# Patient Record
Sex: Female | Born: 1985 | Race: White | Hispanic: No | Marital: Married | Smoking: Former smoker
Health system: Southern US, Community
[De-identification: ages and names within clinical notes are randomized; demographics above are authoritative.]

## PROBLEM LIST (undated history)

## (undated) DIAGNOSIS — K5190 Ulcerative colitis, unspecified, without complications: Secondary | ICD-10-CM

## (undated) DIAGNOSIS — IMO0002 Reserved for concepts with insufficient information to code with codable children: Secondary | ICD-10-CM

## (undated) DIAGNOSIS — E538 Deficiency of other specified B group vitamins: Secondary | ICD-10-CM

## (undated) DIAGNOSIS — D649 Anemia, unspecified: Secondary | ICD-10-CM

## (undated) DIAGNOSIS — F419 Anxiety disorder, unspecified: Secondary | ICD-10-CM

## (undated) DIAGNOSIS — R112 Nausea with vomiting, unspecified: Secondary | ICD-10-CM

## (undated) DIAGNOSIS — R519 Headache, unspecified: Secondary | ICD-10-CM

## (undated) DIAGNOSIS — Z9889 Other specified postprocedural states: Secondary | ICD-10-CM

## (undated) DIAGNOSIS — M329 Systemic lupus erythematosus, unspecified: Secondary | ICD-10-CM

## (undated) DIAGNOSIS — E559 Vitamin D deficiency, unspecified: Secondary | ICD-10-CM

## (undated) DIAGNOSIS — N921 Excessive and frequent menstruation with irregular cycle: Secondary | ICD-10-CM

## (undated) HISTORY — PX: TUBAL LIGATION: SHX77

## (undated) HISTORY — PX: BREAST LUMPECTOMY: SHX2

## (undated) HISTORY — PX: TONSILLECTOMY AND ADENOIDECTOMY: SUR1326

## (undated) HISTORY — PX: TONSILLECTOMY: SUR1361

---

## 2019-01-04 ENCOUNTER — Other Ambulatory Visit: Payer: Self-pay

## 2019-01-04 ENCOUNTER — Emergency Department
Admission: EM | Admit: 2019-01-04 | Discharge: 2019-01-04 | Disposition: A | Discharge status: Home / Self Care | Payer: Medicaid Other | Attending: Emergency Medicine | Admitting: Emergency Medicine

## 2019-01-04 ENCOUNTER — Encounter: Payer: Self-pay | Admitting: *Deleted

## 2019-01-04 ENCOUNTER — Emergency Department: Payer: Medicaid Other

## 2019-01-04 DIAGNOSIS — R197 Diarrhea, unspecified: Secondary | ICD-10-CM | POA: Insufficient documentation

## 2019-01-04 DIAGNOSIS — Z79899 Other long term (current) drug therapy: Secondary | ICD-10-CM | POA: Insufficient documentation

## 2019-01-04 DIAGNOSIS — R1013 Epigastric pain: Secondary | ICD-10-CM | POA: Insufficient documentation

## 2019-01-04 LAB — URINALYSIS, COMPLETE (UACMP) WITH MICROSCOPIC
Bacteria, UA: NONE SEEN
Bilirubin Urine: NEGATIVE
Glucose, UA: NEGATIVE mg/dL
Hgb urine dipstick: NEGATIVE
Ketones, ur: 20 mg/dL — AB
Leukocytes,Ua: NEGATIVE
Nitrite: NEGATIVE
Protein, ur: NEGATIVE mg/dL
Specific Gravity, Urine: 1.019 (ref 1.005–1.030)
pH: 5 (ref 5.0–8.0)

## 2019-01-04 LAB — CBC
HCT: 40.6 % (ref 36.0–46.0)
Hemoglobin: 13.1 g/dL (ref 12.0–15.0)
MCH: 27.7 pg (ref 26.0–34.0)
MCHC: 32.3 g/dL (ref 30.0–36.0)
MCV: 85.8 fL (ref 80.0–100.0)
Platelets: 255 10*3/uL (ref 150–400)
RBC: 4.73 MIL/uL (ref 3.87–5.11)
RDW: 15.7 % — ABNORMAL HIGH (ref 11.5–15.5)
WBC: 5.7 10*3/uL (ref 4.0–10.5)
nRBC: 0 % (ref 0.0–0.2)

## 2019-01-04 LAB — C-REACTIVE PROTEIN: CRP: <0.8 mg/dL (ref <1.0)

## 2019-01-04 LAB — COMPREHENSIVE METABOLIC PANEL
ALT: 12 U/L (ref 0–44)
AST: 21 U/L (ref 15–41)
Albumin: 4 g/dL (ref 3.5–5.0)
Alkaline Phosphatase: 52 U/L (ref 38–126)
Anion gap: 10 (ref 5–15)
BUN: 9 mg/dL (ref 6–20)
CO2: 21 mmol/L — ABNORMAL LOW (ref 22–32)
Calcium: 9.1 mg/dL (ref 8.9–10.3)
Chloride: 107 mmol/L (ref 98–111)
Creatinine, Ser: 0.74 mg/dL (ref 0.44–1.00)
GFR calc Af Amer: >60 mL/min (ref >60)
GFR calc non Af Amer: >60 mL/min (ref >60)
Glucose, Bld: 99 mg/dL (ref 70–99)
Potassium: 3.9 mmol/L (ref 3.5–5.1)
Sodium: 138 mmol/L (ref 135–145)
Total Bilirubin: 0.6 mg/dL (ref 0.3–1.2)
Total Protein: 8.2 g/dL — ABNORMAL HIGH (ref 6.5–8.1)

## 2019-01-04 LAB — LIPASE, BLOOD: Lipase: 26 U/L (ref 11–51)

## 2019-01-04 LAB — SEDIMENTATION RATE: Sed Rate: 35 mm/hr — ABNORMAL HIGH (ref 0–20)

## 2019-01-04 LAB — POCT PREGNANCY, URINE: Preg Test, Ur: NEGATIVE

## 2019-01-04 MED ORDER — MORPHINE SULFATE (PF) 2 MG/ML IV SOLN
2.0000 mg | Freq: Once | INTRAVENOUS | Status: AC
  Administered 2019-01-04: 17:00:00 2 mg via INTRAVENOUS
  Filled 2019-01-04: qty 1

## 2019-01-04 MED ORDER — ALUM & MAG HYDROXIDE-SIMETH 200-200-20 MG/5ML PO SUSP
30.0000 mL | Freq: Once | ORAL | Status: AC
  Administered 2019-01-04: 17:00:00 30 mL via ORAL
  Filled 2019-01-04: qty 30

## 2019-01-04 MED ORDER — ONDANSETRON HCL 4 MG/2ML IJ SOLN
4.0000 mg | Freq: Once | INTRAMUSCULAR | Status: AC
  Administered 2019-01-04: 17:00:00 4 mg via INTRAVENOUS
  Filled 2019-01-04: qty 2

## 2019-01-04 MED ORDER — IOHEXOL 240 MG/ML SOLN
25.0000 mL | INTRAMUSCULAR | Status: AC
  Administered 2019-01-04: 25 mL via ORAL
  Filled 2019-01-04 (×2): qty 25

## 2019-01-04 MED ORDER — TRAMADOL HCL 50 MG PO TABS: 50.0000 mg | ORAL_TABLET | Freq: Four times a day (QID) | ORAL | 0 refills | Status: AC | PRN

## 2019-01-04 MED ORDER — SODIUM CHLORIDE 0.9 % IV BOLUS: 1000.0000 mL | Freq: Once | INTRAVENOUS | Status: DC

## 2019-01-04 MED ORDER — IOHEXOL 300 MG/ML  SOLN
75.0000 mL | Freq: Once | INTRAMUSCULAR | Status: AC | PRN
  Administered 2019-01-04: 18:00:00 75 mL via INTRAVENOUS
  Filled 2019-01-04: qty 75

## 2019-01-04 MED ORDER — PREDNISONE 10 MG (21) PO TBPK: ORAL_TABLET | ORAL | 0 refills | Status: DC

## 2019-01-04 NOTE — ED Triage Notes (Signed)
Pt to triage via wheelchair.  Pt has abd pain.  Pt has diarrhea x 3 today.  No vomiting. Hx colitis.  Pt alert.

## 2019-01-04 NOTE — ED Notes (Signed)
Patient transported to CT 

## 2019-01-04 NOTE — ED Provider Notes (Signed)
[facility] Emergency Department Provider Note  ____________________________________________  Time seen: Approximately 4:42 PM  I have reviewed the triage vital signs and the nursing notes.   HISTORY  Chief Complaint Abdominal Pain    HPI Diana Ramos is a 33 y.o. female with a history of ulcerative colitis, presents to the emergency department with 8 out of 10, episodic epigastric abdominal pain worsened with movement and relieved with rest for the past 24 hours.  Patient reports that her abdominal pain is associated with approximately 3-4 episodes of nonbloody diarrhea over the past 1 day.  She is nauseated but has not experienced emesis.  Patient reports that she was admitted 2 to 3 years ago in [place] for ulcerative colitis.  Patient reports that at the time, she did not have health insurance and was unable to follow-up with GI.  She has not been treated with any medications chronically for ulcerative colitis.  Patient reports that she has had mild flares of ulcerative colitis since admission but reports that symptoms have been manageable at home.  Her abdominal pain has not been this bad since prior admission.  She denies fever and chills at home.  She has not taken any medications to relieve her abdominal pain.    History reviewed. No pertinent past medical history.  There are no active problems to display for this patient.     Prior to Admission medications   Medication Sig Start Date End Date Taking? Authorizing Provider  predniSONE (STERAPRED UNI-PAK 21 TAB) 10 MG (21) TBPK tablet Take 6 tabs the the 1st day. Take 6 tabs the the 2nd day. Take 5 tabs the the 3rd day. Take 5 tabs the 4th day. Take 4 tabs the the 5th day.Take 4 tabs the the 6th day.Take 3 tabs the 7th day.Take 3 tabs the 8th day. Take 2 tabs the 9th day. Take 2 tabs the 10th day. Take 1 tab the 11th day. Take 1 tab the 12th day. 01/04/19   Lannie Fields, PA-C  SPRINTEC 28 0.25-35  MG-MCG tablet Take 1 tablet by mouth daily. 12/04/18   [provider]  traMADol (ULTRAM) 50 MG tablet Take 1 tablet (50 mg total) by mouth every 6 (six) hours as needed for up to 3 days. 01/04/19 01/07/19  Lannie Fields, PA-C    Allergies Ciprofloxacin  No family history on file.  Social History Social History   Tobacco Use  . Smoking status: Never Smoker  . Smokeless tobacco: Never Used  Substance Use Topics  . Alcohol use: Not Currently  . Drug use: Not Currently     Review of Systems  Constitutional: No fever/chills Eyes: No visual changes. No discharge ENT: No upper respiratory complaints. Cardiovascular: no chest pain. Respiratory: no cough. No SOB. Gastrointestinal: Patient has epigastric abdominal pain and nausea. Patient has diarrhea.  No constipation. Genitourinary: Negative for dysuria. No hematuria Musculoskeletal: Negative for musculoskeletal pain. Skin: Negative for rash, abrasions, lacerations, ecchymosis. Neurological: Negative for headaches, focal weakness or numbness.   ____________________________________________   PHYSICAL EXAM:  VITAL SIGNS: ED Triage Vitals  Enc Vitals Group     BP 01/04/19 1537 116/77     Pulse Rate 01/04/19 1537 72     Resp 01/04/19 1537 18     Temp 01/04/19 1537 98.1 F (36.7 C)     Temp Source 01/04/19 1537 Oral     SpO2 01/04/19 1537 98 %     Weight 01/04/19 1538 115 lb (52.2 kg)  Height 01/04/19 1538 5' (1.524 m)     Head Circumference --      Peak Flow --      Pain Score 01/04/19 1538 8     Pain Loc --      Pain Edu? --      Excl. in [place]? --      Constitutional: Alert and oriented. Well appearing and in no acute distress. Eyes: Conjunctivae are normal. PERRL. EOMI. Head: Atraumatic. ENT:      Ears: TMs are pearly.       Nose: No congestion/rhinnorhea.      Mouth/Throat: Mucous membranes are moist.  Neck: No stridor. No cervical spine tenderness to palpation. Cardiovascular: Normal rate, regular  rhythm. Normal S1 and S2.  Good peripheral circulation. Respiratory: Normal respiratory effort without tachypnea or retractions. Lungs CTAB. Good air entry to the bases with no decreased or absent breath sounds. Gastrointestinal: Bowel sounds 4 quadrants.  Patient has significant tenderness to palpation in the epigastric abdomen with associated guarding.  No rigidity.  No palpable masses. No distention. No CVA tenderness. Musculoskeletal: Full range of motion to all extremities. No gross deformities appreciated. Neurologic:  Normal speech and language. No gross focal neurologic deficits are appreciated.  Skin:  Skin is warm, dry and intact. No rash noted. Psychiatric: Mood and affect are normal. Speech and behavior are normal. Patient exhibits appropriate insight and judgement.   ____________________________________________   LABS (all labs ordered are listed, but only abnormal results are displayed)  Labs Reviewed  COMPREHENSIVE METABOLIC PANEL - Abnormal; Notable for the following components:      Result Value   CO2 21 (*)    Total Protein 8.2 (*)    All other components within normal limits  CBC - Abnormal; Notable for the following components:   RDW 15.7 (*)    All other components within normal limits  URINALYSIS, COMPLETE (UACMP) WITH MICROSCOPIC - Abnormal; Notable for the following components:   Color, Urine YELLOW (*)    APPearance HAZY (*)    Ketones, ur 20 (*)    All other components within normal limits  LIPASE, BLOOD  SEDIMENTATION RATE  C-REACTIVE PROTEIN  POC URINE PREG, ED  POCT PREGNANCY, URINE   ____________________________________________  EKG   ____________________________________________  RADIOLOGY I personally viewed and evaluated these images as part of my medical decision making, as well as reviewing the written report by the radiologist    Ct Abdomen Pelvis W Contrast  Result Date: 01/04/2019 CLINICAL DATA:  Abdominal pain, diarrhea EXAM: CT  ABDOMEN AND PELVIS WITH CONTRAST TECHNIQUE: Multidetector CT imaging of the abdomen and pelvis was performed using the standard protocol following bolus administration of intravenous contrast. CONTRAST:  73m OMNIPAQUE IOHEXOL 300 MG/ML  SOLN COMPARISON:  None. FINDINGS: Lower chest: No acute abnormality. Hepatobiliary: No focal liver abnormality is seen. No gallstones, gallbladder wall thickening, or biliary dilatation. Pancreas: Unremarkable. No pancreatic ductal dilatation or surrounding inflammatory changes. Spleen: Normal in size without focal abnormality. Adrenals/Urinary Tract: Adrenal glands are unremarkable. Kidneys are normal, without renal calculi, focal lesion, or hydronephrosis. Bladder is unremarkable. Stomach/Bowel: Stomach is within normal limits. Appendix appears normal. No bowel dilatation. Distal and terminal ileal bowel wall thickening which is fluid-filled most concerning for enteritis secondary to an inflammatory or infectious etiology. Small amount of pelvic free fluid. Vascular/Lymphatic: No significant vascular findings are present. No enlarged abdominal or pelvic lymph nodes. Reproductive: Uterus and bilateral adnexa are unremarkable. Other: No abdominal wall hernia or abnormality. No abdominopelvic  ascites. Musculoskeletal: No acute osseous abnormality. No aggressive osseous lesion. IMPRESSION: 1. Distal and terminal ileal bowel wall thickening which is fluid-filled most concerning for enteritis secondary to an inflammatory or infectious etiology. Small amount of pelvic free fluid. Electronically Signed   By: Kathreen Devoid   On: 01/04/2019 18:29    ____________________________________________    PROCEDURES  Procedure(s) performed:    Procedures    Medications  iohexol (OMNIPAQUE) 240 MG/ML injection 25 mL (25 mLs Oral Contrast Given 01/04/19 1644)  morphine 2 MG/ML injection 2 mg (2 mg Intravenous Given 01/04/19 1647)  sodium chloride 0.9 % bolus 1,000 mL (1,000 mLs  Intravenous Bolus 01/04/19 1652)  ondansetron (ZOFRAN) injection 4 mg (4 mg Intravenous Given 01/04/19 1647)  alum & mag hydroxide-simeth (MAALOX/MYLANTA) 200-200-20 MG/5ML suspension 30 mL (30 mLs Oral Given 01/04/19 1658)  iohexol (OMNIPAQUE) 300 MG/ML solution 75 mL (75 mLs Intravenous Contrast Given 01/04/19 1754)     ____________________________________________   INITIAL IMPRESSION / ASSESSMENT AND PLAN / ED COURSE  Pertinent labs & imaging results that were available during my care of the patient were reviewed by me and considered in my medical decision making (see chart for details).  Review of the [place] CSRS was performed in accordance of the [place] prior to dispensing any controlled drugs.      Assessment and Plan:  Epigastric Abdominal Pain:  33 year old female with a history of ulcerative colitis presents to the emergency department with diarrhea and epigastric abdominal pain for the past 3 days.  Vital signs were reassuring in the emergency department without fever, hypotension or tachycardia.  On physical exam, patient was initially tearful and in a fetal position.  She had guarding with palpation in the epigastric abdomen.  Differential diagnosis included ulcerative colitis, gastritis, pancreatitis, and viral gastroenteritis.  Basic labs obtained in the emergency department were reassuring.  No leukocytosis.  H&H were within reference range.  CT abdomen and pelvis was obtained which revealed ileal wall thickening but no other acute abnormalities.  Patient was given morphine, Zofran and IV fluids in the emergency department.  She was discharged with tramadol and tapered prednisone.  A referral was given to GI.  Vital signs are reassuring prior to discharge.  All patient questions were answered.   ____________________________________________  FINAL CLINICAL IMPRESSION(S) / ED DIAGNOSES  Final diagnoses:  Epigastric pain  Diarrhea, unspecified type      NEW MEDICATIONS  STARTED DURING THIS VISIT:  ED Discharge Orders         Ordered    predniSONE (STERAPRED UNI-PAK 21 TAB) 10 MG (21) TBPK tablet     01/04/19 1851    traMADol (ULTRAM) 50 MG tablet  Every 6 hours PRN     01/04/19 1851              This chart was dictated using voice recognition software/Dragon. Despite best efforts to proofread, errors can occur which can change the meaning. Any change was purely unintentional.    Karren Cobble 01/04/19 1904    Carrie Mew, MD 01/04/19 2312

## 2019-07-04 ENCOUNTER — Other Ambulatory Visit: Payer: Self-pay

## 2019-07-04 DIAGNOSIS — Z20822 Contact with and (suspected) exposure to covid-19: Secondary | ICD-10-CM

## 2019-07-06 LAB — NOVEL CORONAVIRUS, NAA: SARS-CoV-2, NAA: NOT DETECTED

## 2019-09-21 ENCOUNTER — Emergency Department: Payer: Medicaid Other

## 2019-09-21 ENCOUNTER — Other Ambulatory Visit: Payer: Self-pay

## 2019-09-21 ENCOUNTER — Emergency Department
Admission: EM | Admit: 2019-09-21 | Discharge: 2019-09-21 | Disposition: A | Discharge status: Home / Self Care | Payer: Medicaid Other | Attending: Emergency Medicine | Admitting: Emergency Medicine

## 2019-09-21 DIAGNOSIS — Z79899 Other long term (current) drug therapy: Secondary | ICD-10-CM | POA: Insufficient documentation

## 2019-09-21 DIAGNOSIS — K2900 Acute gastritis without bleeding: Secondary | ICD-10-CM

## 2019-09-21 DIAGNOSIS — R1013 Epigastric pain: Secondary | ICD-10-CM | POA: Diagnosis present

## 2019-09-21 HISTORY — DX: Ulcerative colitis, unspecified, without complications: K51.90

## 2019-09-21 LAB — POCT PREGNANCY, URINE: Preg Test, Ur: NEGATIVE

## 2019-09-21 LAB — URINALYSIS, COMPLETE (UACMP) WITH MICROSCOPIC
Bacteria, UA: NONE SEEN
Bilirubin Urine: NEGATIVE
Glucose, UA: NEGATIVE mg/dL
Hgb urine dipstick: NEGATIVE
Ketones, ur: 20 mg/dL — AB
Leukocytes,Ua: NEGATIVE
Nitrite: NEGATIVE
Protein, ur: NEGATIVE mg/dL
Specific Gravity, Urine: 1.02 (ref 1.005–1.030)
pH: 6 (ref 5.0–8.0)

## 2019-09-21 LAB — COMPREHENSIVE METABOLIC PANEL
ALT: 11 U/L (ref 0–44)
AST: 27 U/L (ref 15–41)
Albumin: 4.3 g/dL (ref 3.5–5.0)
Alkaline Phosphatase: 52 U/L (ref 38–126)
Anion gap: 9 (ref 5–15)
BUN: 11 mg/dL (ref 6–20)
CO2: 21 mmol/L — ABNORMAL LOW (ref 22–32)
Calcium: 8.9 mg/dL (ref 8.9–10.3)
Chloride: 108 mmol/L (ref 98–111)
Creatinine, Ser: 0.64 mg/dL (ref 0.44–1.00)
GFR calc Af Amer: >60 mL/min (ref >60)
GFR calc non Af Amer: >60 mL/min (ref >60)
Glucose, Bld: 98 mg/dL (ref 70–99)
Potassium: 4.4 mmol/L (ref 3.5–5.1)
Sodium: 138 mmol/L (ref 135–145)
Total Bilirubin: 1 mg/dL (ref 0.3–1.2)
Total Protein: 8.1 g/dL (ref 6.5–8.1)

## 2019-09-21 LAB — CBC
HCT: 39.1 % (ref 36.0–46.0)
Hemoglobin: 12.7 g/dL (ref 12.0–15.0)
MCH: 27.5 pg (ref 26.0–34.0)
MCHC: 32.5 g/dL (ref 30.0–36.0)
MCV: 84.8 fL (ref 80.0–100.0)
Platelets: 259 10*3/uL (ref 150–400)
RBC: 4.61 MIL/uL (ref 3.87–5.11)
RDW: 13 % (ref 11.5–15.5)
WBC: 7.7 10*3/uL (ref 4.0–10.5)
nRBC: 0 % (ref 0.0–0.2)

## 2019-09-21 LAB — LIPASE, BLOOD: Lipase: 28 U/L (ref 11–51)

## 2019-09-21 MED ORDER — FENTANYL CITRATE (PF) 100 MCG/2ML IJ SOLN
25.0000 ug | Freq: Once | INTRAMUSCULAR | Status: AC
  Administered 2019-09-21: 25 ug via INTRAVENOUS
  Filled 2019-09-21: qty 2

## 2019-09-21 MED ORDER — TRAMADOL HCL 50 MG PO TABS: 50.0000 mg | ORAL_TABLET | Freq: Four times a day (QID) | ORAL | 0 refills | Status: DC | PRN

## 2019-09-21 MED ORDER — ONDANSETRON HCL 4 MG/2ML IJ SOLN
4.0000 mg | Freq: Once | INTRAMUSCULAR | Status: AC
  Administered 2019-09-21: 4 mg via INTRAVENOUS
  Filled 2019-09-21: qty 2

## 2019-09-21 MED ORDER — PANTOPRAZOLE SODIUM 20 MG PO TBEC: 20.0000 mg | DELAYED_RELEASE_TABLET | Freq: Every day | ORAL | 0 refills | Status: DC

## 2019-09-21 MED ORDER — SODIUM CHLORIDE 0.9% FLUSH
3.0000 mL | Freq: Once | INTRAVENOUS | Status: AC
  Administered 2019-09-21: 3 mL via INTRAVENOUS

## 2019-09-21 MED ORDER — FENTANYL CITRATE (PF) 100 MCG/2ML IJ SOLN
50.0000 ug | INTRAMUSCULAR | Status: DC | PRN
  Administered 2019-09-21: 50 ug via INTRAVENOUS
  Filled 2019-09-21: qty 2

## 2019-09-21 MED ORDER — LIDOCAINE VISCOUS HCL 2 % MT SOLN
15.0000 mL | Freq: Once | OROMUCOSAL | Status: AC
  Administered 2019-09-21: 15 mL via ORAL
  Filled 2019-09-21: qty 15

## 2019-09-21 MED ORDER — ALUM & MAG HYDROXIDE-SIMETH 200-200-20 MG/5ML PO SUSP
30.0000 mL | Freq: Once | ORAL | Status: AC
  Administered 2019-09-21: 30 mL via ORAL
  Filled 2019-09-21: qty 30

## 2019-09-21 MED ORDER — ONDANSETRON 4 MG PO TBDP: 4.0000 mg | ORAL_TABLET | Freq: Three times a day (TID) | ORAL | 0 refills | Status: DC | PRN

## 2019-09-21 NOTE — ED Notes (Signed)
See triage note  Presents with abd pain  States pain with n/v started last pm  Hx of colitis   And this pain feels like a flare up  Was given some medication in triage  States eased off pain slightly

## 2019-09-21 NOTE — ED Provider Notes (Signed)
[facility] Emergency Department Provider Note  ____________________________________________   First MD Initiated Contact with Patient 09/21/19 1031     (approximate)  I have reviewed the triage vital signs and the nursing notes.   HISTORY  Chief Complaint Abdominal Pain   HPI Diana Ramos is a 33 y.o. female presents to the ED with complaint of epigastric type pain with history of ulcerative colitis.  Patient states that each time she has had flareups of her ulcerative colitis is always been in the epigastric region.  Patient has currently moved to [place] from [place] and has not seen a doctor in [place].  She states that she does not take any routine medication.  She denies any fever, chills, nausea or vomiting.  There is been no diarrhea.  She rates her pain as 6 out of 10.         Past Medical History:  Diagnosis Date  . Ulcerative colitis ([place])     There are no problems to display for this patient.   Past Surgical History:  Procedure Laterality Date  . CESAREAN SECTION     x3    Prior to Admission medications   Medication Sig Start Date End Date Taking? Authorizing Provider  ondansetron (ZOFRAN ODT) 4 MG disintegrating tablet Take 1 tablet (4 mg total) by mouth every 8 (eight) hours as needed for nausea or vomiting. 09/21/19   Johnn Hai, PA-C  pantoprazole (PROTONIX) 20 MG tablet Take 1 tablet (20 mg total) by mouth daily. 09/21/19   Johnn Hai, PA-C  SPRINTEC 28 0.25-35 MG-MCG tablet Take 1 tablet by mouth daily. 12/04/18   [provider]  traMADol (ULTRAM) 50 MG tablet Take 1 tablet (50 mg total) by mouth every 6 (six) hours as needed. 09/21/19   Johnn Hai, PA-C    Allergies Ciprofloxacin  History reviewed. No pertinent family history.  Social History Social History   Tobacco Use  . Smoking status: Never Smoker  . Smokeless tobacco: Never Used  Substance Use Topics  . Alcohol  use: Not Currently  . Drug use: Yes    Types: Marijuana    Review of Systems Constitutional: No fever/chills Eyes: No visual changes. ENT: No sore throat. Cardiovascular: Denies chest pain. Respiratory: Denies shortness of breath. Gastrointestinal: Positive abdominal pain.  No nausea, no vomiting.  No diarrhea.  No constipation. Genitourinary: Negative for dysuria. Musculoskeletal: Negative for back pain. Skin: Negative for rash. Neurological: Negative for headaches, focal weakness or numbness.  ____________________________________________   PHYSICAL EXAM:  VITAL SIGNS: ED Triage Vitals  Enc Vitals Group     BP 09/21/19 0846 120/73     Pulse Rate 09/21/19 0846 85     Resp 09/21/19 0846 16     Temp 09/21/19 1034 98 F (36.7 C)     Temp Source 09/21/19 0846 Oral     SpO2 09/21/19 0846 98 %     Weight 09/21/19 0847 130 lb (59 kg)     Height 09/21/19 0847 5' (1.524 m)     Head Circumference --      Peak Flow --      Pain Score 09/21/19 0846 6     Pain Loc --      Pain Edu? --      Excl. in [place]? --    Constitutional: Alert and oriented. Well appearing and in no acute distress. Eyes: Conjunctivae are normal.  Head: Atraumatic. Neck: No stridor.   Cardiovascular: Normal rate,  regular rhythm. Grossly normal heart sounds.  Good peripheral circulation. Respiratory: Normal respiratory effort.  No retractions. Lungs CTAB. Gastrointestinal: Soft with epigastric tenderness noted on palpation.  There is no tenderness noted around the periumbilical area or lower quadrants.  Sounds normoactive x4 quadrants.  No rebound or referred pain is noted.  No distention.  Musculoskeletal: Moves upper and lower extremities that any difficulty.  Normal gait was noted. Neurologic:  Normal speech and language. No gross focal neurologic deficits are appreciated. No gait instability. Skin:  Skin is warm, dry and intact. No rash noted. Psychiatric: Mood and affect are normal. Speech and behavior are  normal.  ____________________________________________   LABS (all labs ordered are listed, but only abnormal results are displayed)  Labs Reviewed  COMPREHENSIVE METABOLIC PANEL - Abnormal; Notable for the following components:      Result Value   CO2 21 (*)    All other components within normal limits  URINALYSIS, COMPLETE (UACMP) WITH MICROSCOPIC - Abnormal; Notable for the following components:   Color, Urine YELLOW (*)    APPearance HAZY (*)    Ketones, ur 20 (*)    All other components within normal limits  LIPASE, BLOOD  CBC  POC URINE PREG, ED  POCT PREGNANCY, URINE    RADIOLOGY   Official radiology report(s): DG Abdomen 1 View  Result Date: 09/21/2019 CLINICAL DATA:  Abdominal pain EXAM: ABDOMEN - 1 VIEW COMPARISON:  None. FINDINGS: The bowel gas pattern is normal. No radio-opaque calculi or other significant radiographic abnormality are seen. IMPRESSION: Negative. Electronically Signed   By: Kerby Moors M.D.   On: 09/21/2019 11:44    ____________________________________________   PROCEDURES  Procedure(s) performed (including Critical Care):  Procedures ____________________________________________   INITIAL IMPRESSION / ASSESSMENT AND PLAN / ED COURSE  As part of my medical decision making, I reviewed the following data within the electronic MEDICAL RECORD NUMBER Notes from prior ED visits and [place] Controlled Substance Database  33 year old female presents to the ED complaining of a flareup of her "ulcerative colitis".  Patient states that she recently moved from [place] to [place] and has not had the money to obtain a PCP.  Patient denies any nausea, vomiting, diarrhea.  There is been no fever or chills.  On exam there is only epigastric tenderness.  Labs are reassuring and x-ray did not show any free air.  Patient was given a GI cocktail which she refused to drink.  Patient was given both Zofran and fentanyl prior to being seen.  While waiting for her  x-ray and lab results she was given another nontoxic prior to discharge.  Because her exam shows more epigastric tenderness she was given a prescription for Zofran ODT, Protonix 20 mg, and tramadol if needed for pain.  She is encouraged to watch what she is eating, no alcohol, no smoking.  She is encouraged to follow-up with Dr. Alice Reichert if any continued problems.  ____________________________________________   FINAL CLINICAL IMPRESSION(S) / ED DIAGNOSES  Final diagnoses:  Acute gastritis without hemorrhage, unspecified gastritis type     ED Discharge Orders         Ordered    pantoprazole (PROTONIX) 20 MG tablet  Daily     09/21/19 1241    traMADol (ULTRAM) 50 MG tablet  Every 6 hours PRN     09/21/19 1241    ondansetron (ZOFRAN ODT) 4 MG disintegrating tablet  Every 8 hours PRN     09/21/19 1241  Note:  This document was prepared using Dragon voice recognition software and may include unintentional dictation errors.    Johnn Hai, PA-C 09/21/19 1600    Lavonia Drafts, MD 09/21/19 [phone]

## 2019-09-21 NOTE — ED Triage Notes (Signed)
Pt c/o abd pain with N/V/D states she has a hx of ulcerative colitis and this feels like a flare up.

## 2019-09-21 NOTE — Discharge Instructions (Signed)
Call make an appointment with the gastroenterologist as listed on your discharge papers.  Begin taking medication as directed.  Zofran is for nausea, tramadol for pain and Protonix is to reduce the acid in your stomach which should help with the pain that you are experiencing today.  Do not smoke or drink alcohol as this will increase your pain.  Increase fluids.  Do not drive or operate machinery while taking the tramadol as it could cause drowsiness.

## 2020-01-05 ENCOUNTER — Ambulatory Visit: Payer: Medicaid Other | Attending: Internal Medicine

## 2020-01-05 DIAGNOSIS — Z23 Encounter for immunization: Secondary | ICD-10-CM

## 2020-01-05 NOTE — Progress Notes (Signed)
   Covid-19 Vaccination Clinic  Name:  Maikayla Beggs    MRN: [id number] DOB: Nov 06, 1985  01/05/2020  Ms. Sadiq was observed post Covid-19 immunization for 15 minutes without incident. She was provided with Vaccine Information Sheet and instruction to access the V-Safe system.   Ms. Abadi was instructed to call 911 with any severe reactions post vaccine: Marland Kitchen Difficulty breathing  . Swelling of face and throat  . A fast heartbeat  . A bad rash all over body  . Dizziness and weakness   Immunizations Administered    Name Date Dose VIS Date Route   Pfizer COVID-19 Vaccine 01/05/2020 11:42 AM 0.3 mL 09/01/2019 Intramuscular   Manufacturer: [place]   Lot: [id number]   [place] [employer]: [id number]

## 2020-01-30 ENCOUNTER — Ambulatory Visit: Payer: Medicaid Other | Attending: Internal Medicine

## 2020-01-30 DIAGNOSIS — Z23 Encounter for immunization: Secondary | ICD-10-CM

## 2020-01-30 NOTE — Progress Notes (Signed)
   Covid-19 Vaccination Clinic  Name:  Diana Ramos    MRN: [id number] DOB: Mar 04, 1986  01/30/2020  [name] was observed post Covid-19 immunization for 15 minutes without incident. She was provided with Vaccine Information Sheet and instruction to access the V-Safe system.   [name] was instructed to call 911 with any severe reactions post vaccine: Marland Kitchen Difficulty breathing  . Swelling of face and throat  . A fast heartbeat  . A bad rash all over body  . Dizziness and weakness   Immunizations Administered    Name Date Dose VIS Date Route   Pfizer COVID-19 Vaccine 01/30/2020 11:14 AM 0.3 mL 11/15/2018 Intramuscular   Manufacturer: [place]   Lot: [id number]   [place]: [id number]

## 2020-05-02 ENCOUNTER — Other Ambulatory Visit: Payer: Self-pay

## 2020-05-02 DIAGNOSIS — N6320 Unspecified lump in the left breast, unspecified quadrant: Secondary | ICD-10-CM

## 2020-05-10 ENCOUNTER — Ambulatory Visit
Admission: RE | Admit: 2020-05-10 | Discharge: 2020-05-10 | Disposition: A | Discharge status: Home / Self Care | Payer: Medicaid Other | Source: Ambulatory Visit

## 2020-05-10 ENCOUNTER — Other Ambulatory Visit: Payer: Self-pay

## 2020-05-10 DIAGNOSIS — N6320 Unspecified lump in the left breast, unspecified quadrant: Secondary | ICD-10-CM

## 2020-05-22 ENCOUNTER — Other Ambulatory Visit: Payer: Self-pay

## 2020-05-22 DIAGNOSIS — N6320 Unspecified lump in the left breast, unspecified quadrant: Secondary | ICD-10-CM

## 2020-05-22 DIAGNOSIS — R928 Other abnormal and inconclusive findings on diagnostic imaging of breast: Secondary | ICD-10-CM

## 2020-05-30 ENCOUNTER — Ambulatory Visit
Admission: RE | Admit: 2020-05-30 | Discharge: 2020-05-30 | Disposition: A | Discharge status: Home / Self Care | Payer: Medicaid Other | Source: Ambulatory Visit

## 2020-05-30 DIAGNOSIS — N6320 Unspecified lump in the left breast, unspecified quadrant: Secondary | ICD-10-CM | POA: Diagnosis present

## 2020-05-30 DIAGNOSIS — R928 Other abnormal and inconclusive findings on diagnostic imaging of breast: Secondary | ICD-10-CM | POA: Diagnosis not present

## 2020-05-30 HISTORY — PX: BREAST BIOPSY: SHX20

## 2020-06-03 LAB — SURGICAL PATHOLOGY

## 2020-06-04 ENCOUNTER — Telehealth: Payer: Self-pay

## 2020-06-04 NOTE — Telephone Encounter (Signed)
Diana Ramos has an appt with Dr. Lysle Pearl on 06/11/2020 at 10:15.  Patient is aware of time/date.

## 2020-06-11 ENCOUNTER — Ambulatory Visit: Payer: Self-pay | Admitting: Surgery

## 2020-06-11 NOTE — H&P (Signed)
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 female who was referred by Diana Ramos, C* for evaluation of above. Change was noted on last screening mammogram. Patient does routinely do self breast exams.  Patient has noted a change on breast exam. Age of menarche was 27. Patient denies hormonal therapy. Patient is G6P3. Age of first live birth was 30. Patient did breast feed. Patient denies nipple discharge. Patient denies previous breast biopsy. Patient denies a personal history of breast cancer.   Past Medical History:  has a past medical history of Anemia, Migraine, and Noninfective gastroenteritis and colitis, unspecified.  Past Surgical History:  has a past surgical history that includes Cesarean section; Tonsillectomy; and unlisted procedure dentoalveolar structures.  Family History: family history includes Arthritis in her maternal grandmother, mother, and paternal grandmother; Atrial fibrillation (Abnormal heart rhythm sometimes requiring treatment with blood thinners) in her father; Bladder Cancer in her father; COPD in her mother; Berenice Primas' disease in her mother; Heart disease in her father and mother; Hyperthyroidism in her mother; Lung cancer in her father and mother.  Social History:  reports that she has quit smoking. She has never used smokeless tobacco. She reports previous alcohol use. She reports current drug use. Drug: Marijuana.  Current Medications: has a current medication list which includes the following prescription(s): acetaminophen and hyoscyamine.  Allergies:  Allergies as of 06/11/2020 - Reviewed 05/01/2020  Allergen Reaction Noted  . Ciprofloxacin Rash 01/04/2019    ROS:  A 15 point review of systems was performed and was negative except as noted in HPI   Objective:     BP 112/71   Pulse 78   Ht 152.4 cm (5')   Wt 52.6 kg (116 lb)   BMI 22.65 kg/m   Constitutional :  alert, appears stated age,  cooperative and no distress  Lymphatics/Throat:  no asymmetry, masses, or scars  Respiratory:  clear to auscultation bilaterally  Cardiovascular:  regular rate and rhythm  Gastrointestinal: soft, non-tender; bowel sounds normal; no masses,  no organomegaly.   Musculoskeletal: Steady gait and movement  Skin: Cool and moist  Psychiatric: Normal affect, non-agitated, not confused  Breast:  Chaperone present for exam.  Right breast exam with palpable fibroadenoma at 9 oclock position as noted in imaging. No other palpable abnormalities in right breast or axilla.  Left breast with palpable area of concern as noted on [place].  Few other areas consistent with fibrous breast in upper quadrants.  No axillary abnormalities    LABS:  SURGICAL PATHOLOGY  CASE: [id number]  PATIENT: Diana Ramos  Surgical Pathology Report      Specimen Submitted:  A. Breast, left 5:00 2cmfn   Clinical History: Palpable mass with angular margins. Ddx: Fibroadenoma,  papilloma, malignancy. Vision tissue marker clip placed following  ultrasound guided biopsy of LEFT breast at 5 o'clock.     DIAGNOSIS:  A. BREAST, LEFT AT 5:00, 2 CM FROM THE NIPPLE; ULTRASOUND-GUIDED CORE  NEEDLE BIOPSY:  - BENIGN INTRADUCTAL PAPILLOMA.  - NEGATIVE FOR ATYPICAL PROLIFERATIVE BREAST DISEASE.   Comment:  Due to the presence of focal areas of solid growth, immunohistochemical  studies were performed. The studies highlight an intact myoepithelial  layer. There is no evidence of invasive carcinoma or encapsulated  papillary carcinoma. However, complete excision is recommended to fully  exclude an encapsulated papillary carcinoma, or other atypical process.   IHC slides were prepared by Launa Grill, [place]. All controls stained  appropriately.  This test was developed and its performance characteristics determined  by [facility]. It has not been cleared or approved by the [place] Food and Drug  Administration. The FDA does  not require this test to go through  premarket FDA review. This test is used for clinical purposes. It should  not be regarded as investigational or for research. This laboratory is  certified under the Clinical Laboratory Improvement Amendments (CLIA) as  qualified to perform high complexity clinical laboratory testing.   GROSS DESCRIPTION:  A. Labeled: Left breast 5:00 2 cm from nipple lower outer quadrant  Received: Formalin  Time/date in fixative: Collected and placed in formalin at 1:34 PM on  05/30/2020  Cold ischemic time: Less than 1 minute  Total fixation time: 8 hours  Core pieces: 2 cores and multiple additional fragments  Size: Range from 1.6-1.9 cm in length and 0.2 cm in diameter  Description: Received are cores and fragments of yellow fibrofatty  tissue. The fragments are 2 x 0.4 x 0.2 cm in aggregate.  Ink color: Black  Entirely submitted in cassettes 1-3 as follows:  1 - 1 core  2 - 1 core  3 - remaining fragments     Final Diagnosis performed by Allena Napoleon, MD.  Electronically signed  06/03/2020 9:35:54AM  The electronic signature indicates that the named AttendingPathologist  has evaluated the specimen  Technical component performed at [facility], [address], [place],  [place] [address] Lab: [phone] Dir: Rush Farmer, MD, MMM  Professional component performed at [facility], [facility], [place], [place], [place] [address] Lab: [phone]  Dir: Dellia Nims. Reuel Derby, MD   RADS: Addendum by Verdell Face, MD on 06/03/2020 4:58 PM EDT  ADDENDUM REPORT: 06/03/2020 14:46   ADDENDUM:  PATHOLOGY revealed: A. BREAST, LEFT AT 5:00, 2 CM FROM THE NIPPLE;  ULTRASOUND-GUIDED CORE NEEDLE BIOPSY: - BENIGN INTRADUCTAL  PAPILLOMA. - NEGATIVE FOR ATYPICAL PROLIFERATIVE BREAST DISEASE.  Comment: Due to the presence of focal areas of solid growth,  immunohistochemical studies were performed. The studies highlight an  intact  myoepithelial layer. There is no evidence of invasive  carcinoma or encapsulated papillary carcinoma. However, complete  excision is recommended to fully exclude an encapsulated papillary  carcinoma, or other atypical process.   Pathology results are CONCORDANT with imaging findings, per Dr.  Valentino Saxon with excision recommended.   Pathology results and recommendations below were discussed with  patient by telephone on 06/03/2020. Patient reported biopsy site  doing much better with only slight tenderness at the site. Post  biopsy care instructions were reviewed, questions were answered and  my direct phone number was provided to patient. Patient was  instructed to call [facility] if any concerns or  questions arise related to the biopsy. Patient verbalized  understanding of recommendation #2 below.   Recommendation:   1. Request for surgical consultation was relayed to [place] RT at [facility] by Electa Sniff RN on  06/03/2020.   2. I explained to patient there will be a six month follow-up RIGHT  breast ultrasound unless there is an upgrade of pathology on the  LEFT breast surgical excision. If there is an upgrade of pathology  on surgically excised specimen, a RIGHT breast ultrasound biopsy  will be scheduled for a RIGHT breast hypoechoic circumscribed  benign-appearing mass measuring 1.5 x 1.8 x 1.0 cm with interspersed  calcifications, at 9 o'clock 5 cm from the nipple. The RIGHT breast  biopsy will be  determined at the time of surgical excision  pathology.   Pathology results reported by Electa Sniff RN on 06/03/2020.     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  Will proceed with full excisional biopsy due to increased risk of upgrade with papilloma diagnosis.  Plan:     1. 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 The risks of general anesthetic, if used, includes MI, CVA, sudden death or even reaction to anesthetic medications also discussed.  Typical post-op recovery time and possbility of activity restrictions were also discussed.  Alternatives include continued observation.  Benefits include possible symptom relief, pathologic evaluation, and/or curative excision.   The patient verbalized understanding and all questions were answered to the patient's satisfaction.  2. Patient has elected to proceed with surgical treatment. Procedure will be scheduled.

## 2020-06-11 NOTE — H&P (View-Only) (Signed)
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 female who was referred by Diana Ramos, C* for evaluation of above. Change was noted on last screening mammogram. Patient does routinely do self breast exams.  Patient has noted a change on breast exam. Age of menarche was 49. Patient denies hormonal therapy. Patient is G6P3. Age of first live birth was 33. Patient did breast feed. Patient denies nipple discharge. Patient denies previous breast biopsy. Patient denies a personal history of breast cancer.   Past Medical History:  has a past medical history of Anemia, Migraine, and Noninfective gastroenteritis and colitis, unspecified.  Past Surgical History:  has a past surgical history that includes Cesarean section; Tonsillectomy; and unlisted procedure dentoalveolar structures.  Family History: family history includes Arthritis in her maternal grandmother, mother, and paternal grandmother; Atrial fibrillation (Abnormal heart rhythm sometimes requiring treatment with blood thinners) in her father; Bladder Cancer in her father; COPD in her mother; Berenice Primas' disease in her mother; Heart disease in her father and mother; Hyperthyroidism in her mother; Lung cancer in her father and mother.  Social History:  reports that she has quit smoking. She has never used smokeless tobacco. She reports previous alcohol use. She reports current drug use. Drug: Marijuana.  Current Medications: has a current medication list which includes the following prescription(s): acetaminophen and hyoscyamine.  Allergies:  Allergies as of 06/11/2020 - Reviewed 05/01/2020  Allergen Reaction Noted  . Ciprofloxacin Rash 01/04/2019    ROS:  A 15 point review of systems was performed and was negative except as noted in HPI   Objective:     BP 112/71   Pulse 78   Ht 152.4 cm (5')   Wt 52.6 kg (116 lb)   BMI 22.65 kg/m   Constitutional :  alert, appears stated age,  cooperative and no distress  Lymphatics/Throat:  no asymmetry, masses, or scars  Respiratory:  clear to auscultation bilaterally  Cardiovascular:  regular rate and rhythm  Gastrointestinal: soft, non-tender; bowel sounds normal; no masses,  no organomegaly.   Musculoskeletal: Steady gait and movement  Skin: Cool and moist  Psychiatric: Normal affect, non-agitated, not confused  Breast:  Chaperone present for exam.  Right breast exam with palpable fibroadenoma at 9 oclock position as noted in imaging. No other palpable abnormalities in right breast or axilla.  Left breast with palpable area of concern as noted on [place].  Few other areas consistent with fibrous breast in upper quadrants.  No axillary abnormalities    LABS:  SURGICAL PATHOLOGY  CASE: [id number]  PATIENT: Diana Ramos  Surgical Pathology Report      Specimen Submitted:  A. Breast, left 5:00 2cmfn   Clinical History: Palpable mass with angular margins. Ddx: Fibroadenoma,  papilloma, malignancy. Vision tissue marker clip placed following  ultrasound guided biopsy of LEFT breast at 5 o'clock.     DIAGNOSIS:  A. BREAST, LEFT AT 5:00, 2 CM FROM THE NIPPLE; ULTRASOUND-GUIDED CORE  NEEDLE BIOPSY:  - BENIGN INTRADUCTAL PAPILLOMA.  - NEGATIVE FOR ATYPICAL PROLIFERATIVE BREAST DISEASE.   Comment:  Due to the presence of focal areas of solid growth, immunohistochemical  studies were performed. The studies highlight an intact myoepithelial  layer. There is no evidence of invasive carcinoma or encapsulated  papillary carcinoma. However, complete excision is recommended to fully  exclude an encapsulated papillary carcinoma, or other atypical process.   IHC slides were prepared by Launa Grill, [place]. All controls stained  appropriately.  This test was developed and its performance characteristics determined  by [facility]. It has not been cleared or approved by the [place] Food and Drug  Administration. The FDA does  not require this test to go through  premarket FDA review. This test is used for clinical purposes. It should  not be regarded as investigational or for research. This laboratory is  certified under the Clinical Laboratory Improvement Amendments (CLIA) as  qualified to perform high complexity clinical laboratory testing.   GROSS DESCRIPTION:  A. Labeled: Left breast 5:00 2 cm from nipple lower outer quadrant  Received: Formalin  Time/date in fixative: Collected and placed in formalin at 1:34 PM on  05/30/2020  Cold ischemic time: Less than 1 minute  Total fixation time: 8 hours  Core pieces: 2 cores and multiple additional fragments  Size: Range from 1.6-1.9 cm in length and 0.2 cm in diameter  Description: Received are cores and fragments of yellow fibrofatty  tissue. The fragments are 2 x 0.4 x 0.2 cm in aggregate.  Ink color: Black  Entirely submitted in cassettes 1-3 as follows:  1 - 1 core  2 - 1 core  3 - remaining fragments     Final Diagnosis performed by Allena Napoleon, MD.  Electronically signed  06/03/2020 9:35:54AM  The electronic signature indicates that the named AttendingPathologist  has evaluated the specimen  Technical component performed at [facility], [address] [place],  [place] [address] Lab: [phone] Dir: Rush Farmer, MD, MMM  Professional component performed at [facility], [facility], [place], [place], [place] [address] Lab: [phone]  Dir: Dellia Nims. Reuel Derby, MD   RADS: Addendum by Verdell Face, MD on 06/03/2020 4:58 PM EDT  ADDENDUM REPORT: 06/03/2020 14:46   ADDENDUM:  PATHOLOGY revealed: A. BREAST, LEFT AT 5:00, 2 CM FROM THE NIPPLE;  ULTRASOUND-GUIDED CORE NEEDLE BIOPSY: - BENIGN INTRADUCTAL  PAPILLOMA. - NEGATIVE FOR ATYPICAL PROLIFERATIVE BREAST DISEASE.  Comment: Due to the presence of focal areas of solid growth,  immunohistochemical studies were performed. The studies highlight an  intact  myoepithelial layer. There is no evidence of invasive  carcinoma or encapsulated papillary carcinoma. However, complete  excision is recommended to fully exclude an encapsulated papillary  carcinoma, or other atypical process.   Pathology results are CONCORDANT with imaging findings, per Dr.  Valentino Saxon with excision recommended.   Pathology results and recommendations below were discussed with  patient by telephone on 06/03/2020. Patient reported biopsy site  doing much better with only slight tenderness at the site. Post  biopsy care instructions were reviewed, questions were answered and  my direct phone number was provided to patient. Patient was  instructed to call [facility] if any concerns or  questions arise related to the biopsy. Patient verbalized  understanding of recommendation #2 below.   Recommendation:   1. Request for surgical consultation was relayed to [place] RT at [facility] by Electa Sniff RN on  06/03/2020.   2. I explained to patient there will be a six month follow-up RIGHT  breast ultrasound unless there is an upgrade of pathology on the  LEFT breast surgical excision. If there is an upgrade of pathology  on surgically excised specimen, a RIGHT breast ultrasound biopsy  will be scheduled for a RIGHT breast hypoechoic circumscribed  benign-appearing mass measuring 1.5 x 1.8 x 1.0 cm with interspersed  calcifications, at 9 o'clock 5 cm from the nipple. The RIGHT breast  biopsy will be  determined at the time of surgical excision  pathology.   Pathology results reported by Electa Sniff RN on 06/03/2020.     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  Will proceed with full excisional biopsy due to increased risk of upgrade with papilloma diagnosis.  Plan:     1. 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 The risks of general anesthetic, if used, includes MI, CVA, sudden death or even reaction to anesthetic medications also discussed.  Typical post-op recovery time and possbility of activity restrictions were also discussed.  Alternatives include continued observation.  Benefits include possible symptom relief, pathologic evaluation, and/or curative excision.   The patient verbalized understanding and all questions were answered to the patient's satisfaction.  2. Patient has elected to proceed with surgical treatment. Procedure will be scheduled.

## 2020-06-12 ENCOUNTER — Other Ambulatory Visit: Payer: Self-pay | Admitting: Surgery

## 2020-06-12 DIAGNOSIS — D242 Benign neoplasm of left breast: Secondary | ICD-10-CM

## 2020-06-18 ENCOUNTER — Encounter
Admission: RE | Admit: 2020-06-18 | Discharge: 2020-06-18 | Disposition: A | Discharge status: Home / Self Care | Payer: Medicaid Other | Source: Ambulatory Visit | Attending: Surgery | Admitting: Surgery

## 2020-06-18 ENCOUNTER — Other Ambulatory Visit: Payer: Self-pay

## 2020-06-18 HISTORY — DX: Systemic lupus erythematosus, unspecified: M32.9

## 2020-06-18 HISTORY — DX: Reserved for concepts with insufficient information to code with codable children: IMO0002

## 2020-06-18 HISTORY — DX: Anemia, unspecified: D64.9

## 2020-06-18 NOTE — Patient Instructions (Addendum)
Your procedure is scheduled on: Friday, Oct. 1 Report to Day Surgery on the 2nd floor of the [facility]. To find out your arrival time, please call [phone] between 1PM - 3PM on: Thursday, Sept. 30  REMEMBER: Instructions that are not followed completely may result in serious medical risk, up to and including death; or upon the discretion of your surgeon and anesthesiologist your surgery may need to be rescheduled.  Do not eat food after midnight the night before surgery.  No gum chewing, lozengers or hard candies.  You may however, drink CLEAR liquids up to 2 hours before you are scheduled to arrive for your surgery. Do not drink anything within 2 hours of your scheduled arrival time.  Clear liquids include: - water  - apple juice without pulp - gatorade (not RED) - black coffee or tea (Do NOT add milk or creamers to the coffee or tea) Do NOT drink anything that is not on this list.  TAKE THESE MEDICATIONS THE MORNING OF SURGERY WITH A SIP OF WATER:  1.  Tylenol if needed for pain  One week prior to surgery: Stop Anti-inflammatories (NSAIDS) such as Advil, Aleve, Ibuprofen, Motrin, Naproxen, Naprosyn and Aspirin based products such as Excedrin, Goodys Powder, BC Powder. Stop ANY OVER THE COUNTER supplements until after surgery.  No Alcohol for 24 hours before or after surgery.  No Smoking including e-cigarettes for 24 hours prior to surgery.   Do not use any "recreational" drugs for at least a week prior to your surgery.  Please be advised that the combination of cocaine and anesthesia may have negative outcomes, up to and including death. If you test positive for cocaine, your surgery will be cancelled.  On the morning of surgery brush your teeth with toothpaste and water, you may rinse your mouth with mouthwash if you wish. Do not swallow any toothpaste or mouthwash.  Do not wear jewelry, make-up, hairpins, clips or nail polish.  Do not wear lotions, powders, or  perfumes.   Do not shave 48 hours prior to surgery.   Do not bring valuables to the hospital. [facility] is not responsible for any missing/lost belongings or valuables.   Use CHG Soap as directed on instruction sheet.  Notify your doctor if there is any change in your medical condition (cold, fever, infection).  Wear comfortable clothing (specific to your surgery type) to the hospital.  Plan for stool softeners for home use; pain medications have a tendency to cause constipation. You can also help prevent constipation by eating foods high in fiber such as fruits and vegetables and drinking plenty of fluids as your diet allows.  After surgery, you can help prevent lung complications by doing breathing exercises.  Take deep breaths and cough every 1-2 hours. Your doctor may order a device called an Incentive Spirometer to help you take deep breaths.  If you are being discharged the day of surgery, you will not be allowed to drive home. You will need a responsible adult (18 years or older) to drive you home and stay with you that night.   If you are taking public transportation, you will need to have a responsible adult (18 years or older) with you. Please confirm with your physician that it is acceptable to use public transportation.   Please call the [facility] Dept. at [phone] if you have any questions about these instructions.  Visitation Policy:  Patients undergoing a surgery or procedure may have one family member or support  person with them as long as that person is not COVID-19 positive or experiencing its symptoms.  That person may remain in the waiting area during the procedure.  Masking is required regardless of vaccination status.

## 2020-06-19 ENCOUNTER — Other Ambulatory Visit: Payer: Self-pay | Admitting: Surgery

## 2020-06-19 ENCOUNTER — Ambulatory Visit
Admission: RE | Admit: 2020-06-19 | Discharge: 2020-06-19 | Disposition: A | Discharge status: Home / Self Care | Payer: Medicaid Other | Source: Ambulatory Visit | Attending: Surgery | Admitting: Surgery

## 2020-06-19 ENCOUNTER — Other Ambulatory Visit
Admission: RE | Admit: 2020-06-19 | Discharge: 2020-06-19 | Disposition: A | Discharge status: Home / Self Care | Payer: Medicaid Other | Source: Ambulatory Visit | Attending: Surgery | Admitting: Surgery

## 2020-06-19 DIAGNOSIS — Z20822 Contact with and (suspected) exposure to covid-19: Secondary | ICD-10-CM | POA: Insufficient documentation

## 2020-06-19 DIAGNOSIS — Z1231 Encounter for screening mammogram for malignant neoplasm of breast: Secondary | ICD-10-CM | POA: Diagnosis not present

## 2020-06-19 DIAGNOSIS — D242 Benign neoplasm of left breast: Secondary | ICD-10-CM

## 2020-06-19 DIAGNOSIS — Z01812 Encounter for preprocedural laboratory examination: Secondary | ICD-10-CM | POA: Diagnosis not present

## 2020-06-19 LAB — SARS CORONAVIRUS 2 (TAT 6-24 HRS): SARS Coronavirus 2: NEGATIVE

## 2020-06-21 ENCOUNTER — Other Ambulatory Visit: Payer: Self-pay

## 2020-06-21 ENCOUNTER — Ambulatory Visit
Admission: RE | Admit: 2020-06-21 | Discharge: 2020-06-21 | Disposition: A | Discharge status: Home / Self Care | Payer: Medicaid Other | Attending: Surgery | Admitting: Surgery

## 2020-06-21 ENCOUNTER — Encounter
Admission: RE | Disposition: A | Discharge status: Home / Self Care | Payer: Self-pay | Source: Home / Self Care | Attending: Surgery

## 2020-06-21 ENCOUNTER — Ambulatory Visit
Admission: RE | Admit: 2020-06-21 | Discharge: 2020-06-21 | Disposition: A | Discharge status: Home / Self Care | Payer: Medicaid Other | Source: Ambulatory Visit | Attending: Surgery | Admitting: Surgery

## 2020-06-21 ENCOUNTER — Encounter: Payer: Self-pay | Admitting: Surgery

## 2020-06-21 ENCOUNTER — Ambulatory Visit: Payer: Medicaid Other | Admitting: Urgent Care

## 2020-06-21 DIAGNOSIS — Z8249 Family history of ischemic heart disease and other diseases of the circulatory system: Secondary | ICD-10-CM | POA: Insufficient documentation

## 2020-06-21 DIAGNOSIS — Z881 Allergy status to other antibiotic agents status: Secondary | ICD-10-CM | POA: Insufficient documentation

## 2020-06-21 DIAGNOSIS — D649 Anemia, unspecified: Secondary | ICD-10-CM | POA: Insufficient documentation

## 2020-06-21 DIAGNOSIS — N62 Hypertrophy of breast: Secondary | ICD-10-CM | POA: Diagnosis not present

## 2020-06-21 DIAGNOSIS — D242 Benign neoplasm of left breast: Secondary | ICD-10-CM

## 2020-06-21 DIAGNOSIS — Z8261 Family history of arthritis: Secondary | ICD-10-CM | POA: Diagnosis not present

## 2020-06-21 DIAGNOSIS — Z8052 Family history of malignant neoplasm of bladder: Secondary | ICD-10-CM | POA: Diagnosis not present

## 2020-06-21 DIAGNOSIS — Z419 Encounter for procedure for purposes other than remedying health state, unspecified: Secondary | ICD-10-CM

## 2020-06-21 DIAGNOSIS — N6022 Fibroadenosis of left breast: Secondary | ICD-10-CM | POA: Diagnosis not present

## 2020-06-21 DIAGNOSIS — Z801 Family history of malignant neoplasm of trachea, bronchus and lung: Secondary | ICD-10-CM | POA: Diagnosis not present

## 2020-06-21 DIAGNOSIS — K279 Peptic ulcer, site unspecified, unspecified as acute or chronic, without hemorrhage or perforation: Secondary | ICD-10-CM | POA: Insufficient documentation

## 2020-06-21 DIAGNOSIS — Z87891 Personal history of nicotine dependence: Secondary | ICD-10-CM | POA: Insufficient documentation

## 2020-06-21 DIAGNOSIS — G43909 Migraine, unspecified, not intractable, without status migrainosus: Secondary | ICD-10-CM | POA: Diagnosis not present

## 2020-06-21 DIAGNOSIS — Z8349 Family history of other endocrine, nutritional and metabolic diseases: Secondary | ICD-10-CM | POA: Insufficient documentation

## 2020-06-21 HISTORY — PX: BREAST EXCISIONAL BIOPSY: SUR124

## 2020-06-21 HISTORY — PX: BREAST BIOPSY WITH RADIO FREQUENCY LOCALIZER: SHX6895

## 2020-06-21 LAB — URINE DRUG SCREEN, QUALITATIVE (ARMC ONLY)
Amphetamines, Ur Screen: NOT DETECTED
Barbiturates, Ur Screen: NOT DETECTED
Benzodiazepine, Ur Scrn: NOT DETECTED
Cannabinoid 50 Ng, Ur ~~LOC~~: POSITIVE — AB
Cocaine Metabolite,Ur ~~LOC~~: NOT DETECTED
MDMA (Ecstasy)Ur Screen: NOT DETECTED
Methadone Scn, Ur: NOT DETECTED
Opiate, Ur Screen: NOT DETECTED
Phencyclidine (PCP) Ur S: NOT DETECTED
Tricyclic, Ur Screen: NOT DETECTED

## 2020-06-21 LAB — POCT PREGNANCY, URINE: Preg Test, Ur: NEGATIVE

## 2020-06-21 SURGERY — BREAST BIOPSY WITH RADIO FREQUENCY LOCALIZER
Anesthesia: General | Site: Breast | Laterality: Left

## 2020-06-21 MED ORDER — CHLORHEXIDINE GLUCONATE 0.12 % MT SOLN: 15.0000 mL | Freq: Once | OROMUCOSAL | Status: AC

## 2020-06-21 MED ORDER — LIDOCAINE HCL (PF) 1 % IJ SOLN
INTRAMUSCULAR | Status: AC
  Filled 2020-06-21: qty 30

## 2020-06-21 MED ORDER — MIDAZOLAM HCL 2 MG/2ML IJ SOLN
INTRAMUSCULAR | Status: AC
  Filled 2020-06-21: qty 2

## 2020-06-21 MED ORDER — BUPIVACAINE-EPINEPHRINE (PF) 0.5% -1:200000 IJ SOLN
INTRAMUSCULAR | Status: AC
  Filled 2020-06-21: qty 30

## 2020-06-21 MED ORDER — ONDANSETRON HCL 4 MG/2ML IJ SOLN
4.0000 mg | Freq: Once | INTRAMUSCULAR | Status: AC
  Administered 2020-06-21: 4 mg via INTRAVENOUS

## 2020-06-21 MED ORDER — ONDANSETRON HCL 4 MG/2ML IJ SOLN
INTRAMUSCULAR | Status: DC | PRN
  Administered 2020-06-21: 4 mg via INTRAVENOUS

## 2020-06-21 MED ORDER — FENTANYL CITRATE (PF) 100 MCG/2ML IJ SOLN
INTRAMUSCULAR | Status: DC | PRN
  Administered 2020-06-21 (×3): 25 ug via INTRAVENOUS

## 2020-06-21 MED ORDER — PROPOFOL 10 MG/ML IV BOLUS
INTRAVENOUS | Status: AC
  Filled 2020-06-21: qty 20

## 2020-06-21 MED ORDER — ORAL CARE MOUTH RINSE: 15.0000 mL | Freq: Once | OROMUCOSAL | Status: AC

## 2020-06-21 MED ORDER — CELECOXIB 200 MG PO CAPS
ORAL_CAPSULE | ORAL | Status: AC
  Administered 2020-06-21: 200 mg via ORAL
  Filled 2020-06-21: qty 1

## 2020-06-21 MED ORDER — DOCUSATE SODIUM 100 MG PO CAPS: 100.0000 mg | ORAL_CAPSULE | Freq: Two times a day (BID) | ORAL | 0 refills | Status: AC | PRN

## 2020-06-21 MED ORDER — DEXAMETHASONE SODIUM PHOSPHATE 10 MG/ML IJ SOLN
INTRAMUSCULAR | Status: DC | PRN
  Administered 2020-06-21: 10 mg via INTRAVENOUS

## 2020-06-21 MED ORDER — LIDOCAINE HCL (CARDIAC) PF 100 MG/5ML IV SOSY
PREFILLED_SYRINGE | INTRAVENOUS | Status: DC | PRN
  Administered 2020-06-21: 40 mg via INTRAVENOUS

## 2020-06-21 MED ORDER — ONDANSETRON HCL 4 MG/2ML IJ SOLN
4.0000 mg | Freq: Once | INTRAMUSCULAR | Status: AC | PRN
  Administered 2020-06-21: 4 mg via INTRAVENOUS

## 2020-06-21 MED ORDER — ONDANSETRON HCL 4 MG/2ML IJ SOLN
INTRAMUSCULAR | Status: AC
  Filled 2020-06-21: qty 2

## 2020-06-21 MED ORDER — FAMOTIDINE 20 MG PO TABS: 20.0000 mg | ORAL_TABLET | Freq: Once | ORAL | Status: AC

## 2020-06-21 MED ORDER — CHLORHEXIDINE GLUCONATE 0.12 % MT SOLN
OROMUCOSAL | Status: AC
  Administered 2020-06-21: 15 mL via OROMUCOSAL
  Filled 2020-06-21: qty 15

## 2020-06-21 MED ORDER — LACTATED RINGERS IV SOLN: INTRAVENOUS | Status: DC

## 2020-06-21 MED ORDER — FAMOTIDINE 20 MG PO TABS
ORAL_TABLET | ORAL | Status: AC
  Administered 2020-06-21: 20 mg via ORAL
  Filled 2020-06-21: qty 1

## 2020-06-21 MED ORDER — METOCLOPRAMIDE HCL 5 MG/ML IJ SOLN: 10.0000 mg | Freq: Once | INTRAMUSCULAR | Status: DC

## 2020-06-21 MED ORDER — METOCLOPRAMIDE HCL 5 MG/ML IJ SOLN
INTRAMUSCULAR | Status: AC
  Administered 2020-06-21: 10 mg
  Filled 2020-06-21: qty 2

## 2020-06-21 MED ORDER — GABAPENTIN 300 MG PO CAPS: 300.0000 mg | ORAL_CAPSULE | ORAL | Status: AC

## 2020-06-21 MED ORDER — FENTANYL CITRATE (PF) 100 MCG/2ML IJ SOLN
INTRAMUSCULAR | Status: AC
  Filled 2020-06-21: qty 2

## 2020-06-21 MED ORDER — CELECOXIB 200 MG PO CAPS: 200.0000 mg | ORAL_CAPSULE | ORAL | Status: AC

## 2020-06-21 MED ORDER — CEFAZOLIN SODIUM-DEXTROSE 2-4 GM/100ML-% IV SOLN
2.0000 g | INTRAVENOUS | Status: AC
  Administered 2020-06-21: 2 g via INTRAVENOUS

## 2020-06-21 MED ORDER — FENTANYL CITRATE (PF) 100 MCG/2ML IJ SOLN: 25.0000 ug | INTRAMUSCULAR | Status: DC | PRN

## 2020-06-21 MED ORDER — LIDOCAINE HCL 1 % IJ SOLN
INTRAMUSCULAR | Status: DC | PRN
  Administered 2020-06-21: 5 mL

## 2020-06-21 MED ORDER — ACETAMINOPHEN 325 MG PO TABS: 650.0000 mg | ORAL_TABLET | Freq: Three times a day (TID) | ORAL | 0 refills | Status: AC | PRN

## 2020-06-21 MED ORDER — GABAPENTIN 300 MG PO CAPS
ORAL_CAPSULE | ORAL | Status: AC
  Administered 2020-06-21: 300 mg via ORAL
  Filled 2020-06-21: qty 1

## 2020-06-21 MED ORDER — LIDOCAINE HCL (PF) 2 % IJ SOLN
INTRAMUSCULAR | Status: AC
  Filled 2020-06-21: qty 5

## 2020-06-21 MED ORDER — MIDAZOLAM HCL 2 MG/2ML IJ SOLN
INTRAMUSCULAR | Status: DC | PRN
  Administered 2020-06-21: 2 mg via INTRAVENOUS

## 2020-06-21 MED ORDER — CEFAZOLIN SODIUM-DEXTROSE 2-4 GM/100ML-% IV SOLN
INTRAVENOUS | Status: AC
  Filled 2020-06-21: qty 100

## 2020-06-21 MED ORDER — IBUPROFEN 800 MG PO TABS: 800.0000 mg | ORAL_TABLET | Freq: Three times a day (TID) | ORAL | 0 refills | Status: DC | PRN

## 2020-06-21 MED ORDER — HYDROCODONE-ACETAMINOPHEN 5-325 MG PO TABS
1.0000 | ORAL_TABLET | Freq: Four times a day (QID) | ORAL | 0 refills | Status: DC | PRN
Start: 2020-06-21 — End: 2021-01-02

## 2020-06-21 MED ORDER — PROPOFOL 10 MG/ML IV BOLUS
INTRAVENOUS | Status: DC | PRN
  Administered 2020-06-21: 30 mg via INTRAVENOUS
  Administered 2020-06-21: 100 mg via INTRAVENOUS

## 2020-06-21 MED ORDER — CHLORHEXIDINE GLUCONATE CLOTH 2 % EX PADS: 6.0000 | MEDICATED_PAD | Freq: Once | CUTANEOUS | Status: DC

## 2020-06-21 MED ORDER — DEXAMETHASONE SODIUM PHOSPHATE 10 MG/ML IJ SOLN
INTRAMUSCULAR | Status: AC
  Filled 2020-06-21: qty 1

## 2020-06-21 SURGICAL SUPPLY — 49 items
ADH SKN CLS APL DERMABOND .7 (GAUZE/BANDAGES/DRESSINGS) ×1
APL PRP STRL LF DISP 70% ISPRP (MISCELLANEOUS) ×2
APPLIER CLIP 11 MED OPEN (CLIP)
APR CLP MED 11 20 MLT OPN (CLIP)
BLADE SURG 15 STRL LF DISP TIS (BLADE) ×1 IMPLANT
BLADE SURG 15 STRL SS (BLADE) ×3
CANISTER SUCT 1200ML W/VALVE (MISCELLANEOUS) ×3 IMPLANT
CHLORAPREP W/TINT 26 (MISCELLANEOUS) ×5 IMPLANT
CLIP APPLIE 11 MED OPEN (CLIP) IMPLANT
CNTNR SPEC 2.5X3XGRAD LEK (MISCELLANEOUS)
CONT SPEC 4OZ STER OR WHT (MISCELLANEOUS)
CONT SPEC 4OZ STRL OR WHT (MISCELLANEOUS)
CONTAINER SPEC 2.5X3XGRAD LEK (MISCELLANEOUS) IMPLANT
COVER WAND RF STERILE (DRAPES) ×3 IMPLANT
DERMABOND ADVANCED (GAUZE/BANDAGES/DRESSINGS) ×2
DERMABOND ADVANCED .7 DNX12 (GAUZE/BANDAGES/DRESSINGS) ×1 IMPLANT
DEVICE DSSCT PLSMBLD 3.0S LGHT (MISCELLANEOUS) ×1 IMPLANT
DEVICE DUBIN SPECIMEN MAMMOGRA (MISCELLANEOUS) ×3 IMPLANT
DRAPE LAPAROTOMY 77X122 PED (DRAPES) ×3 IMPLANT
ELECT CAUTERY BLADE TIP 2.5 (TIP) ×3
ELECT REM PT RETURN 9FT ADLT (ELECTROSURGICAL) ×3
ELECTRODE CAUTERY BLDE TIP 2.5 (TIP) ×1 IMPLANT
ELECTRODE REM PT RTRN 9FT ADLT (ELECTROSURGICAL) ×1 IMPLANT
GLOVE BIOGEL PI IND STRL 7.0 (GLOVE) ×1 IMPLANT
GLOVE BIOGEL PI INDICATOR 7.0 (GLOVE) ×2
GLOVE SURG SYN 6.5 ES PF (GLOVE) ×6 IMPLANT
GLOVE SURG SYN 6.5 PF PI (GLOVE) ×2 IMPLANT
GOWN STRL REUS W/ TWL LRG LVL3 (GOWN DISPOSABLE) ×3 IMPLANT
GOWN STRL REUS W/TWL LRG LVL3 (GOWN DISPOSABLE) ×9
KIT MARKER MARGIN INK (KITS) ×2 IMPLANT
KIT TURNOVER KIT A (KITS) ×3 IMPLANT
LABEL OR SOLS (LABEL) ×3 IMPLANT
LIGHT WAVEGUIDE WIDE FLAT (MISCELLANEOUS) IMPLANT
MARKER MARGIN CORRECT CLIP (MARKER) ×2 IMPLANT
NEEDLE HYPO 22GX1.5 SAFETY (NEEDLE) ×6 IMPLANT
PACK BASIN MINOR (MISCELLANEOUS) ×3 IMPLANT
PLASMABLADE 3.0S W/LIGHT (MISCELLANEOUS) ×3
SET LOCALIZER 20 PROBE US (MISCELLANEOUS) ×3 IMPLANT
SUT MNCRL 4-0 (SUTURE) ×6
SUT MNCRL 4-0 27XMFL (SUTURE) ×2
SUT SILK 3 0 12 30 (SUTURE) IMPLANT
SUT VIC AB 3-0 SH 27 (SUTURE) ×6
SUT VIC AB 3-0 SH 27X BRD (SUTURE) ×2 IMPLANT
SUTURE MNCRL 4-0 27XMF (SUTURE) ×2 IMPLANT
SYR 20ML LL LF (SYRINGE) ×3 IMPLANT
SYR BULB IRRIG 60ML STRL (SYRINGE) ×3 IMPLANT
TUBING CONNECTING 10 (TUBING) ×2 IMPLANT
TUBING CONNECTING 10' (TUBING) ×1
WATER STERILE IRR 1000ML POUR (IV SOLUTION) ×3 IMPLANT

## 2020-06-21 NOTE — Op Note (Signed)
Preoperative diagnosis: Left breast papilloma Postoperative diagnosis: Same.   Procedure: RF tag-localized left breast lumpectomy Anesthesia: GETA  Surgeon: Dr. Benjamine Sprague  Wound Classification: Clean  Indications: Patient is a 34 y.o. female with a palpable left breast mass noted on mammography with core biopsy demonstrating papilloma requires RF localizer placement, lumpectomy for full excisional biopsy, concerns of occult malignancy  Specimen: Left breast mass  Complications: None  Estimated Blood Loss: 10 mL  Findings: 1. Specimen mammography shows marker and RF localizer on specimen 2. Pathology call refers gross examination of margins was negative 3. No other palpable mass  identified.    Description of procedure: RF localization was performed by radiology prior to procedure.  Localization studies were reviewed. The patient was taken to the operating room and placed supine on the operating table, and after general anesthesia the left breast and axilla were prepped and draped in the usual sterile fashion. A time-out was completed verifying correct patient, procedure, site, positioning, and implant(s) and/or special equipment prior to beginning this procedure.  By identifying the RF localizer, the probable trajectory and location of the mass was visualized. A skin incision was planned in such a way as to minimize the amount of dissection to reach the mass.  The skin incision was made after infusion of local.  Using a PlasmaBlade, flaps were raised and  Sharp and blunt dissection was then taken down to the mass, taking care to include the entire RF localizer and a margin of grossly normal tissue. The specimen was removed. The specimen was oriented with paint and sent to radiology with the localization studies. Confirmation was received that the entire target lesion had been resected. Burn injury on the medial aspect of the incision noted but seems to be first-degree and viable.   Hemostasis was achieved and the wound closed in layers with  interrupted sutures of 3-0 Vicryl in deep dermal layer and a running subcuticular suture of Monocryl 4-0, then dressed with dermabond.  Band-Aid placed over previous biopsy site. The patient tolerated the procedure well and was taken to the postanesthesia care unit in stable condition. Sponge and instrument count correct at end of procedure.

## 2020-06-21 NOTE — Anesthesia Preprocedure Evaluation (Signed)
Anesthesia Evaluation  Patient identified by MRN, date of birth, ID band Patient awake    Reviewed: Allergy & Precautions, NPO status , Patient's Chart, lab work & pertinent test results  History of Anesthesia Complications Negative for: history of anesthetic complications  Airway Mallampati: II       Dental   Pulmonary neg sleep apnea, neg COPD, former smoker,           Cardiovascular (-) hypertension(-) Past MI and (-) CHF (-) dysrhythmias (-) Valvular Problems/Murmurs     Neuro/Psych neg Seizures    GI/Hepatic Neg liver ROS, PUD,   Endo/Other  neg diabetes  Renal/GU negative Renal ROS     Musculoskeletal   Abdominal   Peds  Hematology  (+) anemia ,   Anesthesia Other Findings   Reproductive/Obstetrics                             Anesthesia Physical Anesthesia Plan  ASA: II  Anesthesia Plan: General   Post-op Pain Management:    Induction: Intravenous  PONV Risk Score and Plan: 3 and Ondansetron, Dexamethasone, Midazolam and Treatment may vary due to age or medical condition  Airway Management Planned: LMA  Additional Equipment:   Intra-op Plan:   Post-operative Plan:   Informed Consent: I have reviewed the patients History and Physical, chart, labs and discussed the procedure including the risks, benefits and alternatives for the proposed anesthesia with the patient or authorized representative who has indicated his/her understanding and acceptance.       Plan Discussed with:   Anesthesia Plan Comments:         Anesthesia Quick Evaluation

## 2020-06-21 NOTE — Anesthesia Postprocedure Evaluation (Signed)
Anesthesia Post Note  Patient: Diana Ramos  Procedure(s) Performed: BREAST BIOPSY WITH RADIO FREQUENCY LOCALIZER (Left Breast)  Patient location during evaluation: Endoscopy Anesthesia Type: General Level of consciousness: awake and alert Pain management: pain level controlled Vital Signs Assessment: post-procedure vital signs reviewed and stable Respiratory status: spontaneous breathing and respiratory function stable Cardiovascular status: stable Anesthetic complications: no   No complications documented.   Last Vitals:  Vitals:   06/21/20 1123 06/21/20 1250  BP: 116/69 104/83  Pulse: 62 89  Resp: 20 16  Temp: (!) 36 C   SpO2: 99% 100%    Last Pain:  Vitals:   06/21/20 1250  TempSrc:   PainSc: 0-No pain                 Cendy Oconnor K

## 2020-06-21 NOTE — Interval H&P Note (Signed)
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  The various methods of treatment have been discussed with the patient and family. After consideration of risks, benefits and other options for treatment, the patient has consented to  Procedure(s): BREAST BIOPSY WITH RADIO FREQUENCY LOCALIZER (Left) as a surgical intervention.  The patient's history has been reviewed, patient examined, no change in status, stable for surgery.  I have reviewed the patient's chart and labs.  Questions were answered to the patient's satisfaction.     Eran Windish Lysle Pearl

## 2020-06-21 NOTE — Transfer of Care (Signed)
Immediate Anesthesia Transfer of Care Note  Patient: Diana Ramos  Procedure(s) Performed: BREAST BIOPSY WITH RADIO FREQUENCY LOCALIZER (Left Breast)  Patient Location: PACU  Anesthesia Type:General  Level of Consciousness: drowsy  Airway & Oxygen Therapy: Patient Spontanous Breathing and Patient connected to face mask oxygen  Post-op Assessment: Report given to RN and Post -op Vital signs reviewed and stable  Post vital signs: Reviewed and stable  Last Vitals:  Vitals Value Taken Time  BP 99/52 06/21/20 0955  Temp    Pulse 70 06/21/20 0959  Resp 21 06/21/20 0959  SpO2 99 % 06/21/20 0959  Vitals shown include unvalidated device data.  Last Pain:  Vitals:   06/21/20 0756  TempSrc: Tympanic  PainSc: 0-No pain         Complications: No complications documented.

## 2020-06-21 NOTE — Anesthesia Procedure Notes (Signed)
Procedure Name: LMA Insertion Date/Time: 06/21/2020 8:49 AM Performed by: Eben Burow, CRNA Pre-anesthesia Checklist: Patient identified, Emergency Drugs available, Suction available and Patient being monitored Patient Re-evaluated:Patient Re-evaluated prior to induction Oxygen Delivery Method: Circle system utilized Preoxygenation: Pre-oxygenation with 100% oxygen Induction Type: IV induction Ventilation: Mask ventilation without difficulty LMA: LMA inserted LMA Size: 3.5 Number of attempts: 1 Placement Confirmation: positive ETCO2 Tube secured with: Tape Dental Injury: Teeth and Oropharynx as per pre-operative assessment

## 2020-06-21 NOTE — Discharge Instructions (Signed)
AMBULATORY SURGERY  DISCHARGE INSTRUCTIONS   1) The drugs that you were given will stay in your system until tomorrow so for the next 24 hours you should not:  A) Drive an automobile B) Make any legal decisions C) Drink any alcoholic beverage   2) You may resume regular meals tomorrow.  Today it is better to start with liquids and gradually work up to solid foods.  You may eat anything you prefer, but it is better to start with liquids, then soup and crackers, and gradually work up to solid foods.   3) Please notify your doctor immediately if you have any unusual bleeding, trouble breathing, redness and pain at the surgery site, drainage, fever, or pain not relieved by medication.    4) Additional Instructions:        Please contact your physician with any problems or Same Day Surgery at [phone], Monday through Friday 6 am to 4 pm, or [place] at [facility] number at [phone].       Removal, Care After This sheet gives you information about how to care for yourself after your procedure. Your health care provider may also give you more specific instructions. If you have problems or questions, contact your health care provider. What can I expect after the procedure? After the procedure, it is common to have:  Soreness.  Bruising.  Itching. Follow these instructions at home: site care Follow instructions from your health care provider about how to take care of your site. Make sure you:  Wash your hands with soap and water before and after you change your bandage (dressing). If soap and water are not available, use hand sanitizer.  Leave stitches (sutures), skin glue, or adhesive strips in place. These skin closures may need to stay in place for 2 weeks or longer. If adhesive strip edges start to loosen and curl up, you may trim the loose edges. Do not remove adhesive strips completely unless your health care provider tells you to do that.  If the area  bleeds or bruises, apply gentle pressure for 10 minutes.  Redness immediately around incision is normal, this will resolve over time.  Okay to remove Band-Aid and OK TO SHOWER IN 24HRS.  Keep needle site covered with daily changes with Band-Aid until healed  Check your site every day for signs of infection. Check for:  WORSENING Redness, swelling, or pain.  Fluid or blood.  Warmth.  Pus or a bad smell.  General instructions  Rest and then return to your normal activities as told by your health care provider.   tylenol and advil as needed for discomfort.  Please alternate between the two every four hours as needed for pain.     Use narcotics, if prescribed, only when tylenol and motrin is not enough to control pain.   325-667m every 8hrs to max of 30040m24hrs (including the 32558mn every norco dose) for the tylenol.     Advil up to 800m34mr dose every 8hrs as needed for pain.    Keep all follow-up visits as told by your health care provider. This is important. Contact a health care provider if:  You have redness, swelling, or pain around your site.  You have fluid or blood coming from your site.  Your site feels warm to the touch.  You have pus or a bad smell coming from your site.  You have a fever.  Your sutures, skin glue, or adhesive strips loosen or come off sooner than  expected. Get help right away if:  You have bleeding that does not stop with pressure or a dressing. Summary  After the procedure, it is common to have some soreness, bruising, and itching at the site.  Follow instructions from your health care provider about how to take care of your site.  Check your site every day for signs of infection.  Contact a health care provider if you have redness, swelling, or pain around your site, or your site feels warm to the touch.  Keep all follow-up visits as told by your health care provider. This is important. This information is not intended to  replace advice given to you by your health care provider. Make sure you discuss any questions you have with your health care provider. Document Released: 10/04/2015 Document Revised: 03/07/2018 Document Reviewed: 03/07/2018 Elsevier Interactive Patient Education  [employer].

## 2020-06-24 ENCOUNTER — Ambulatory Visit: Admit: 2020-06-24 | Payer: Medicaid Other | Admitting: General Surgery

## 2020-06-24 LAB — SURGICAL PATHOLOGY

## 2020-06-24 SURGERY — REPAIR, HERNIA, INGUINAL, ROBOT-ASSISTED, LAPAROSCOPIC, USING MESH
Anesthesia: General | Laterality: Right

## 2020-08-05 ENCOUNTER — Ambulatory Visit
Admission: EM | Admit: 2020-08-05 | Discharge: 2020-08-05 | Disposition: A | Discharge status: Home / Self Care | Payer: BLUE CROSS/BLUE SHIELD | Attending: Emergency Medicine | Admitting: Emergency Medicine

## 2020-08-05 DIAGNOSIS — H1033 Unspecified acute conjunctivitis, bilateral: Secondary | ICD-10-CM

## 2020-08-05 MED ORDER — POLYMYXIN B-TRIMETHOPRIM 10000-0.1 UNIT/ML-% OP SOLN: 1.0000 [drp] | Freq: Four times a day (QID) | OPHTHALMIC | 0 refills | Status: AC

## 2020-08-05 NOTE — ED Provider Notes (Signed)
Roderic Palau    CSN: [id number] Arrival date & time: 08/05/20  7026      History   Chief Complaint Chief Complaint  Patient presents with  . Eye Drainage    HPI Diana Ramos is a 34 y.o. female.   Patient presents with thick white drainage and crusting of her lashes x2 days.  She reports her eyes are itchy.  She denies acute eye pain or changes in her vision.  No fever or chills.  No other symptoms.  Patient states she was seen by her PCP 2 weeks ago and put on an eye ointment but she did not take it as directed because she did not like the ointment.  Her medical history includes lupus, ulcerative colitis, anemia.  The history is provided by the patient and medical records.    Past Medical History:  Diagnosis Date  . Anemia   . Lupus ([place])   . Ulcerative colitis ([place])     There are no problems to display for this patient.   Past Surgical History:  Procedure Laterality Date  . BREAST BIOPSY Left 05/30/2020   [place] bx, papilloma, vision marker  . BREAST BIOPSY WITH RADIO FREQUENCY LOCALIZER Left 06/21/2020   Procedure: BREAST BIOPSY WITH RADIO FREQUENCY LOCALIZER;  Surgeon: Benjamine Sprague, DO;  Location: [facility] ORS;  Service: General;  Laterality: Left;  . CESAREAN SECTION     x3  . TONSILLECTOMY    . TONSILLECTOMY AND ADENOIDECTOMY      OB History   No obstetric history on file.      Home Medications    Prior to Admission medications   Medication Sig Start Date End Date Taking? Authorizing Provider  HYDROcodone-acetaminophen (NORCO) 5-325 MG tablet Take 1 tablet by mouth every 6 (six) hours as needed for up to 6 doses for moderate pain. 06/21/20   Lysle Pearl, Isami, DO  hyoscyamine (LEVSIN SL) 0.125 MG SL tablet Place 0.125 mg under the tongue every 4 (four) hours as needed (IBS).    [provider]  ibuprofen (ADVIL) 800 MG tablet Take 1 tablet (800 mg total) by mouth every 8 (eight) hours as needed for mild pain or moderate pain. 06/21/20   Lysle Pearl,  Isami, DO  trimethoprim-polymyxin b (POLYTRIM) ophthalmic solution Place 1 drop into both eyes 4 (four) times daily for 7 days. 08/05/20 08/12/20  Sharion Balloon, NP    Family History No family history on file.  Social History Social History   Tobacco Use  . Smoking status: Former Research scientist (life sciences)  . Smokeless tobacco: Never Used  Vaping Use  . Vaping Use: Never used  Substance Use Topics  . Alcohol use: Not Currently  . Drug use: Yes    Types: Marijuana     Allergies   Ciprofloxacin   Review of Systems Review of Systems  Constitutional: Negative for chills and fever.  HENT: Negative for ear pain and sore throat.   Eyes: Positive for discharge and itching. Negative for pain and visual disturbance.  Respiratory: Negative for cough and shortness of breath.   Cardiovascular: Negative for chest pain and palpitations.  Gastrointestinal: Negative for abdominal pain and vomiting.  Genitourinary: Negative for dysuria and hematuria.  Musculoskeletal: Negative for arthralgias and back pain.  Skin: Negative for color change and rash.  Neurological: Negative for seizures and syncope.  All other systems reviewed and are negative.    Physical Exam Triage Vital Signs ED Triage Vitals  Enc Vitals Group     BP 08/05/20 0923  115/79     Pulse --      Resp 08/05/20 0923 16     Temp 08/05/20 0923 99 F (37.2 C)     Temp src --      SpO2 08/05/20 0923 98 %     Weight 08/05/20 0924 120 lb (54.4 kg)     Height 08/05/20 0924 5' (1.524 m)     Head Circumference --      Peak Flow --      Pain Score 08/05/20 0923 0     Pain Loc --      Pain Edu? --      Excl. in [place]? --    No data found.  Updated Vital Signs BP 115/79   Temp 99 F (37.2 C)   Resp 16   Ht 5' (1.524 m)   Wt 120 lb (54.4 kg)   LMP 08/05/2020   SpO2 98%   BMI 23.44 kg/m   Visual Acuity Right Eye Distance:   Left Eye Distance:   Bilateral Distance:    Right Eye Near:   Left Eye Near:    Bilateral Near:      Physical Exam Vitals and nursing note reviewed.  Constitutional:      General: She is not in acute distress.    Appearance: She is well-developed.  HENT:     Head: Normocephalic and atraumatic.     Mouth/Throat:     Mouth: Mucous membranes are moist.  Eyes:     General: Vision grossly intact.     Extraocular Movements: Extraocular movements intact.     Conjunctiva/sclera: Conjunctivae normal.     Pupils: Pupils are equal, round, and reactive to light.     Comments: Conjunctiva clear. Healing hordeolum in right lower lid.   Cardiovascular:     Rate and Rhythm: Normal rate and regular rhythm.     Heart sounds: No murmur heard.   Pulmonary:     Effort: Pulmonary effort is normal. No respiratory distress.     Breath sounds: Normal breath sounds.  Abdominal:     Palpations: Abdomen is soft.     Tenderness: There is no abdominal tenderness.  Musculoskeletal:     Cervical back: Neck supple.  Skin:    General: Skin is warm and dry.     Findings: No rash.  Neurological:     General: No focal deficit present.     Mental Status: She is alert and oriented to person, place, and time.     Gait: Gait normal.  Psychiatric:        Mood and Affect: Mood normal.        Behavior: Behavior normal.      UC Treatments / Results  Labs (all labs ordered are listed, but only abnormal results are displayed) Labs Reviewed - No data to display  EKG   Radiology No results found.  Procedures Procedures (including critical care time)  Medications Ordered in UC Medications - No data to display  Initial Impression / Assessment and Plan / UC Course  I have reviewed the triage vital signs and the nursing notes.  Pertinent labs & imaging results that were available during my care of the patient were reviewed by me and considered in my medical decision making (see chart for details).   Conjunctivitis.  Treating with Polytrim eyedrops.  Instructed patient to follow-up with her eye care  provider in 1 to 2 days if her symptoms or not improving.  Instructed her to go  to the ED if she has acute worsening symptoms or new symptoms such as eye pain or changes in her vision.  Patient agrees to plan of care.   Final Clinical Impressions(s) / UC Diagnoses   Final diagnoses:  Acute bacterial conjunctivitis of both eyes     Discharge Instructions     Use the antibiotic eyedrops as prescribed.    Follow-up with your eye doctor for a recheck in 1 to 2 days if your symptoms are not improving.    Go to the emergency department if you have acute eye pain, changes in your vision, or other concerning symptoms.       ED Prescriptions    Medication Sig Dispense Auth. Provider   trimethoprim-polymyxin b (POLYTRIM) ophthalmic solution Place 1 drop into both eyes 4 (four) times daily for 7 days. 10 mL Sharion Balloon, NP     PDMP not reviewed this encounter.   Sharion Balloon, NP 08/05/20 [phone]

## 2020-08-05 NOTE — Discharge Instructions (Addendum)
Use the antibiotic eyedrops as prescribed.    Follow-up with your eye doctor for a recheck in 1 to 2 days if your symptoms are not improving.    Go to the emergency department if you have acute eye pain, changes in your vision, or other concerning symptoms.

## 2020-08-05 NOTE — ED Triage Notes (Signed)
Pt reports having drainage from bila eyes x2 days. No visual disturbances. Eyes are itchy.

## 2020-10-09 IMAGING — MG MM DIGITAL DIAGNOSTIC UNILAT*L* W/ TOMO W/ CAD
4 series · 4 of 12 positions shown · non-contrast
Comparison: Previous exam(s).

CLINICAL DATA: Patient presents for RF tag localization of the LEFT
breast.

EXAM:
ULTRASOUND GUIDED RF TAG LOCALIZATION OF THE LEFT BREAST

[L ML synth-2D]
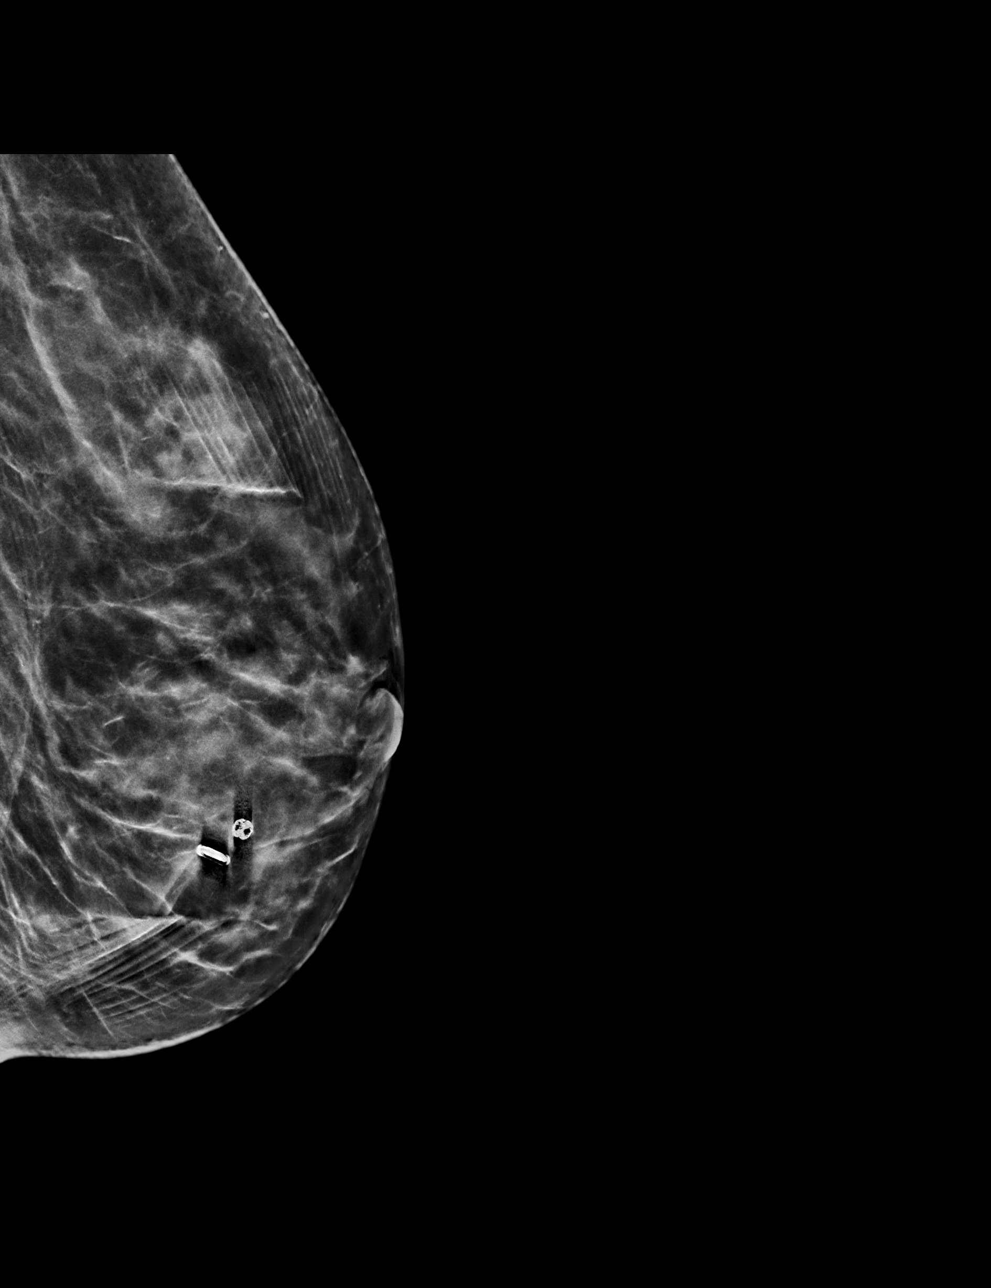

[L CC synth-2D]
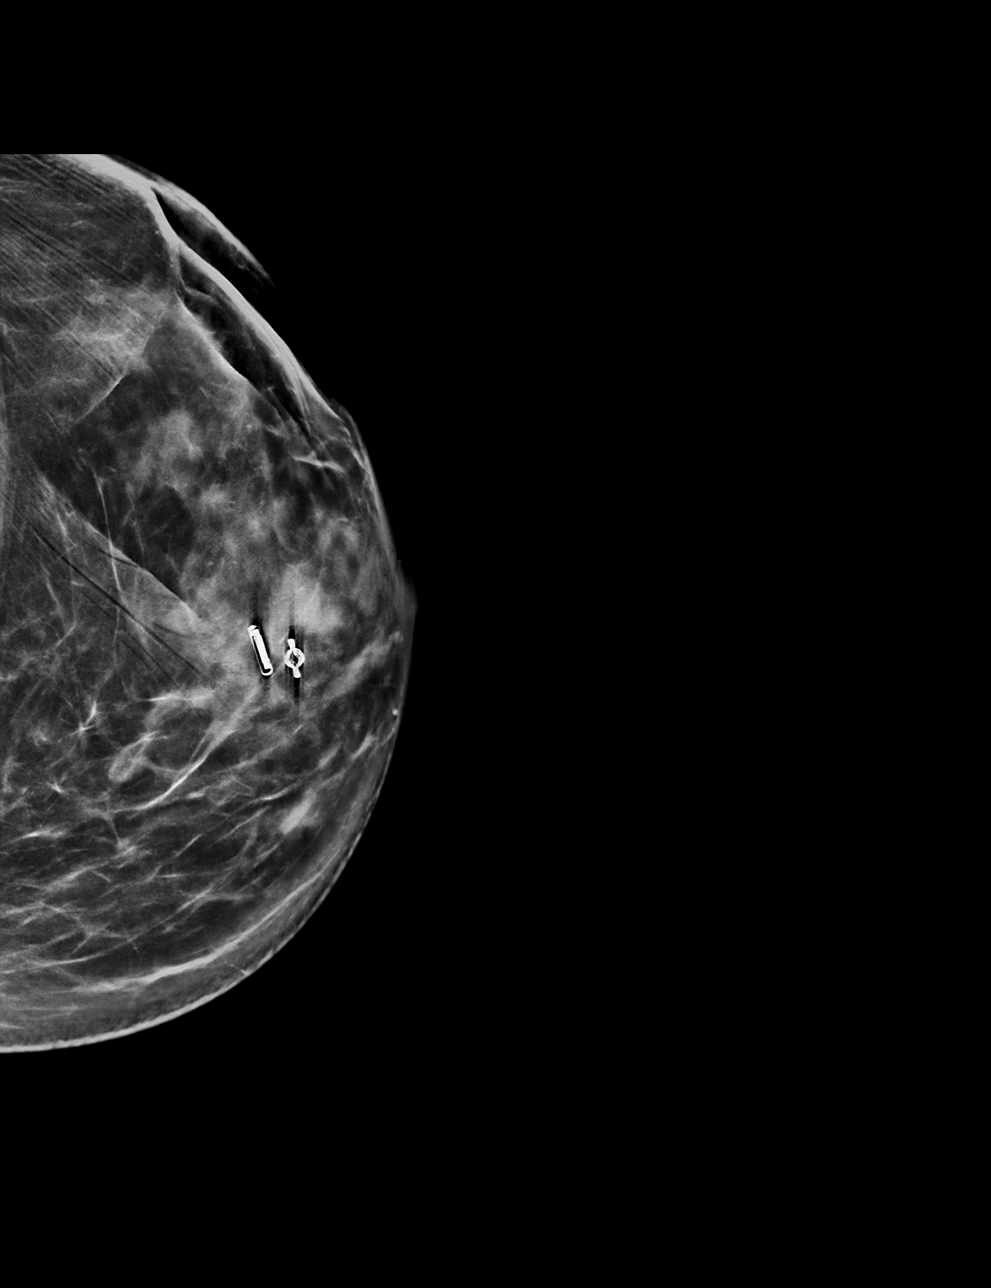

[L ML tomo · tomo slice 33/65.0]
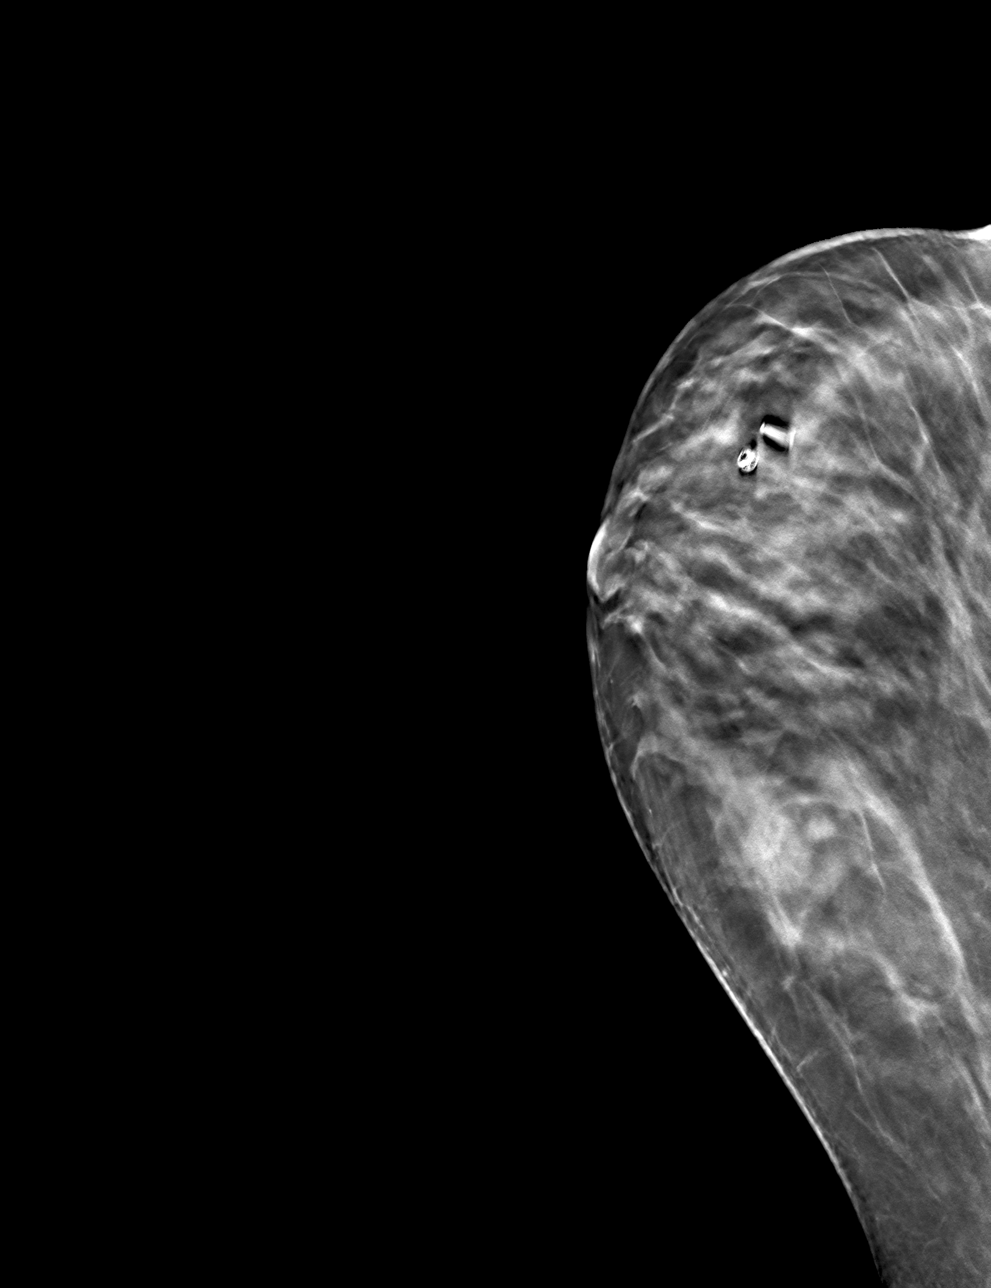

[L CC tomo · tomo slice 35/70.0]
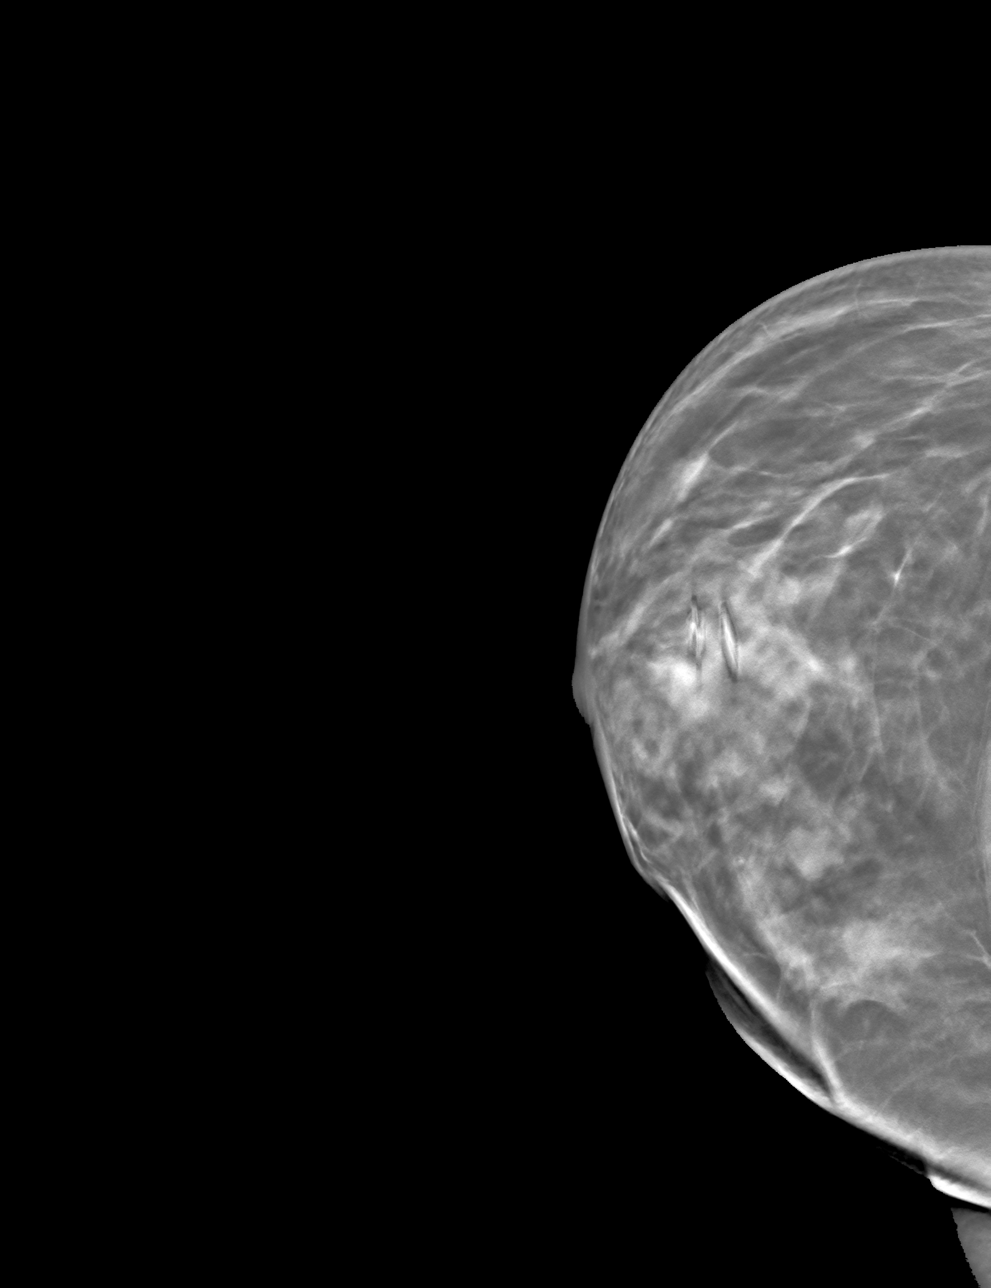

[4 of 12 positions shown; findings below may reference images not displayed]

FINDINGS: Patient presents for radioactive seed localization prior to
LUMPECTOMY. I met with the patient and we discussed the procedure of
seed localization including benefits and alternatives. We discussed
the high likelihood of a successful procedure. We discussed the
risks of the procedure including infection, bleeding, tissue injury
and further surgery. We discussed the low dose of radioactivity
involved in the procedure. Informed, written consent was given.

The usual time-out protocol was performed immediately prior to the
procedure.

Using ultrasound guidance, sterile technique, 1% lidocaine and an
H-IRW radioactive seed, the vision shaped clip in the retroareolar
region of the LEFT breast was localized using a LATERAL to MEDIAL
approach. The follow-up mammogram images confirm the seed in the
expected location and were marked for Dr. Bastl.

Follow-up survey of the patient confirms RF device.

RF device number: [id number]
IMPRESSION: RF tag localization of the LEFT breast.  No apparent complications.

## 2020-10-11 IMAGING — MG MM BREAST SURGICAL SPECIMEN
1 series · 1 of 1 positions shown · non-contrast
Comparison: Previous exam(s).

CLINICAL DATA: Status post radiofrequency tag localized left breast
excision for an intraductal papilloma.

EXAM:
SPECIMEN RADIOGRAPH OF THE LEFT BREAST

[L]
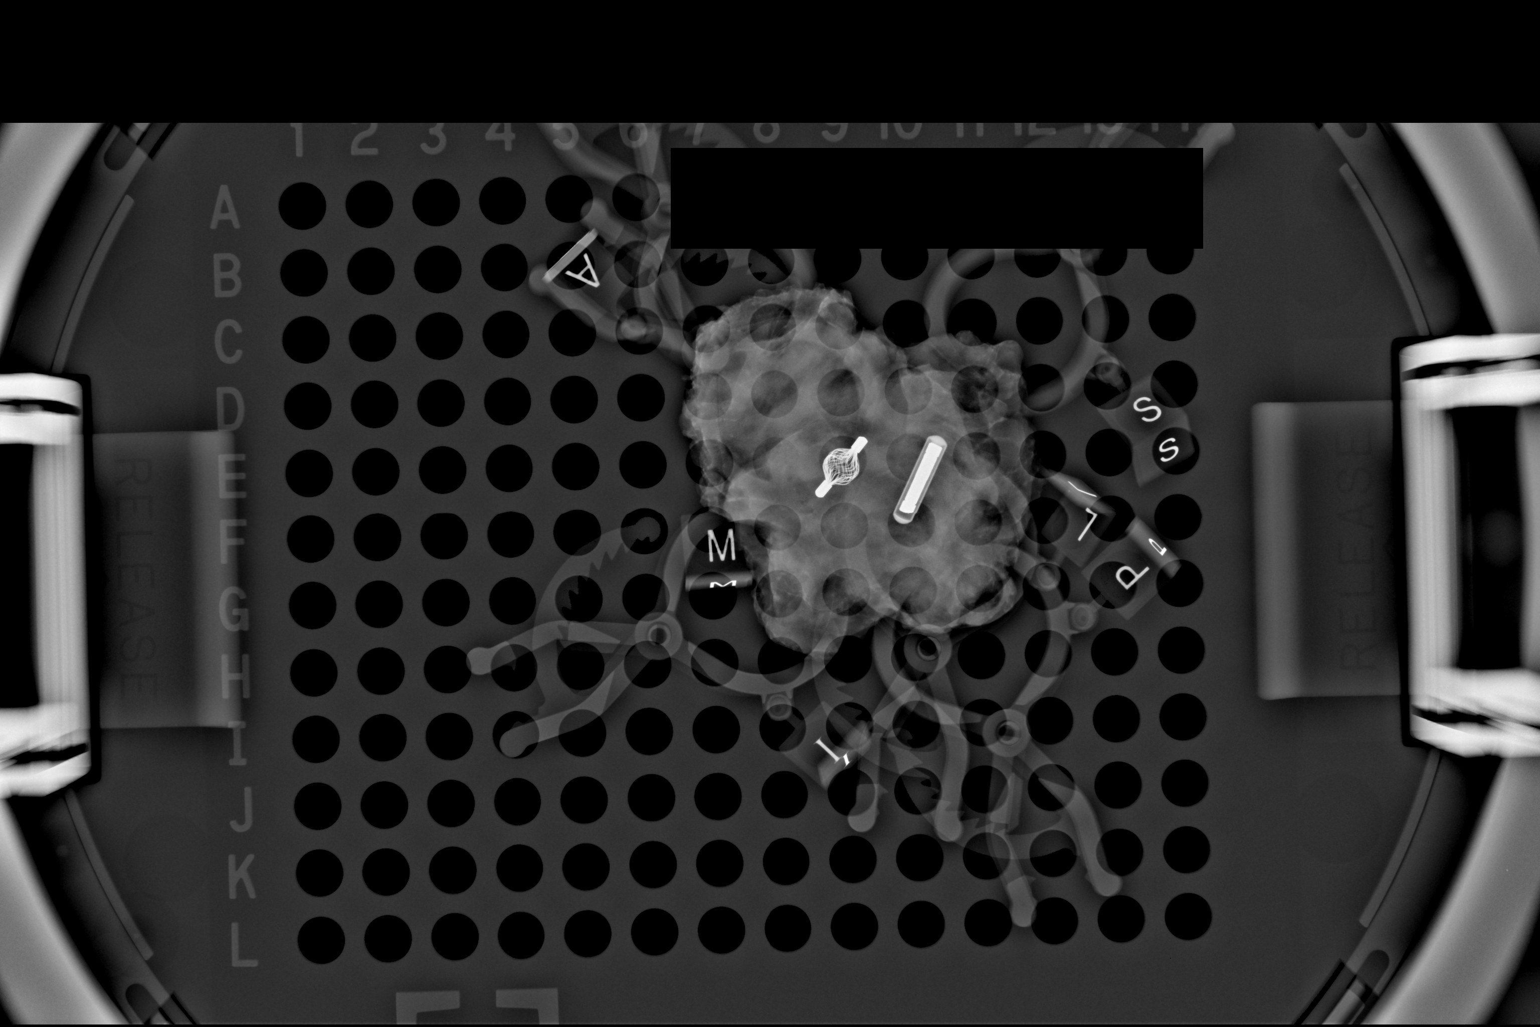

[1 of 1 positions shown; findings below may reference images not displayed]

FINDINGS: Status post excision of the left breast. The radiofrequency tag and
Vision shaped clip are present within the specimen.
IMPRESSION: Specimen radiograph of the left breast.

## 2020-11-26 ENCOUNTER — Other Ambulatory Visit: Payer: Self-pay

## 2020-11-26 DIAGNOSIS — N6310 Unspecified lump in the right breast, unspecified quadrant: Secondary | ICD-10-CM

## 2020-12-02 ENCOUNTER — Ambulatory Visit
Admission: RE | Admit: 2020-12-02 | Discharge: 2020-12-02 | Disposition: A | Discharge status: Home / Self Care | Payer: Medicaid Other | Source: Ambulatory Visit

## 2020-12-02 ENCOUNTER — Other Ambulatory Visit: Payer: Self-pay

## 2020-12-02 DIAGNOSIS — N6310 Unspecified lump in the right breast, unspecified quadrant: Secondary | ICD-10-CM | POA: Diagnosis present

## 2021-01-02 ENCOUNTER — Emergency Department: Payer: Managed Care, Other (non HMO)

## 2021-01-02 ENCOUNTER — Emergency Department
Admission: EM | Admit: 2021-01-02 | Discharge: 2021-01-02 | Disposition: A | Discharge status: Home / Self Care | Payer: Managed Care, Other (non HMO) | Attending: Physician Assistant | Admitting: Physician Assistant

## 2021-01-02 ENCOUNTER — Other Ambulatory Visit: Payer: Self-pay

## 2021-01-02 DIAGNOSIS — Z87891 Personal history of nicotine dependence: Secondary | ICD-10-CM | POA: Insufficient documentation

## 2021-01-02 DIAGNOSIS — L930 Discoid lupus erythematosus: Secondary | ICD-10-CM | POA: Insufficient documentation

## 2021-01-02 DIAGNOSIS — M329 Systemic lupus erythematosus, unspecified: Secondary | ICD-10-CM

## 2021-01-02 DIAGNOSIS — R509 Fever, unspecified: Secondary | ICD-10-CM | POA: Diagnosis not present

## 2021-01-02 DIAGNOSIS — R531 Weakness: Secondary | ICD-10-CM | POA: Diagnosis present

## 2021-01-02 LAB — COMPREHENSIVE METABOLIC PANEL
ALT: 8 U/L (ref 0–44)
AST: 18 U/L (ref 15–41)
Albumin: 4 g/dL (ref 3.5–5.0)
Alkaline Phosphatase: 40 U/L (ref 38–126)
Anion gap: 10 (ref 5–15)
BUN: 11 mg/dL (ref 6–20)
CO2: 26 mmol/L (ref 22–32)
Calcium: 9.1 mg/dL (ref 8.9–10.3)
Chloride: 105 mmol/L (ref 98–111)
Creatinine, Ser: 0.78 mg/dL (ref 0.44–1.00)
GFR, Estimated: >60 mL/min (ref >60)
Glucose, Bld: 86 mg/dL (ref 70–99)
Potassium: 3.2 mmol/L — ABNORMAL LOW (ref 3.5–5.1)
Sodium: 141 mmol/L (ref 135–145)
Total Bilirubin: 0.6 mg/dL (ref 0.3–1.2)
Total Protein: 7.8 g/dL (ref 6.5–8.1)

## 2021-01-02 LAB — URINALYSIS, COMPLETE (UACMP) WITH MICROSCOPIC
Bacteria, UA: NONE SEEN
Bilirubin Urine: NEGATIVE
Glucose, UA: NEGATIVE mg/dL
Hgb urine dipstick: NEGATIVE
Ketones, ur: NEGATIVE mg/dL
Nitrite: NEGATIVE
Protein, ur: NEGATIVE mg/dL
Specific Gravity, Urine: 1.002 — ABNORMAL LOW (ref 1.005–1.030)
pH: 7 (ref 5.0–8.0)

## 2021-01-02 LAB — CBC WITH DIFFERENTIAL/PLATELET
Abs Immature Granulocytes: 0.01 10*3/uL (ref 0.00–0.07)
Basophils Absolute: 0 10*3/uL (ref 0.0–0.1)
Basophils Relative: 0 %
Eosinophils Absolute: 0 10*3/uL (ref 0.0–0.5)
Eosinophils Relative: 1 %
HCT: 33.1 % — ABNORMAL LOW (ref 36.0–46.0)
Hemoglobin: 10.2 g/dL — ABNORMAL LOW (ref 12.0–15.0)
Immature Granulocytes: 0 %
Lymphocytes Relative: 38 %
Lymphs Abs: 2.1 10*3/uL (ref 0.7–4.0)
MCH: 25.4 pg — ABNORMAL LOW (ref 26.0–34.0)
MCHC: 30.8 g/dL (ref 30.0–36.0)
MCV: 82.5 fL (ref 80.0–100.0)
Monocytes Absolute: 0.4 10*3/uL (ref 0.1–1.0)
Monocytes Relative: 7 %
Neutro Abs: 3 10*3/uL (ref 1.7–7.7)
Neutrophils Relative %: 54 %
Platelets: 248 10*3/uL (ref 150–400)
RBC: 4.01 MIL/uL (ref 3.87–5.11)
RDW: 16.6 % — ABNORMAL HIGH (ref 11.5–15.5)
WBC: 5.5 10*3/uL (ref 4.0–10.5)
nRBC: 0 % (ref 0.0–0.2)

## 2021-01-02 LAB — LACTIC ACID, PLASMA: Lactic Acid, Venous: 1.5 mmol/L (ref 0.5–1.9)

## 2021-01-02 LAB — POC URINE PREG, ED: Preg Test, Ur: NEGATIVE

## 2021-01-02 MED ORDER — ACETAMINOPHEN 500 MG PO TABS
1000.0000 mg | ORAL_TABLET | Freq: Once | ORAL | Status: AC
  Administered 2021-01-02: 1000 mg via ORAL
  Filled 2021-01-02: qty 2

## 2021-01-02 MED ORDER — LACTATED RINGERS IV BOLUS
1000.0000 mL | Freq: Once | INTRAVENOUS | Status: AC
  Administered 2021-01-02: 1000 mL via INTRAVENOUS

## 2021-01-02 NOTE — ED Triage Notes (Signed)
Pt to ER with complaints of weakness, neck pain, and fevers since Monday. Pt has lupus and was advised to come to ER. Denies vomiting/ nausea/ diarrhea. Had a negative flu and covid test.   Fever as high as 101.

## 2021-01-02 NOTE — ED Provider Notes (Signed)
[facility] Emergency Department Provider Note ____________________________________________   Event Date/Time   First MD Initiated Contact with Patient 01/02/21 1344     (approximate)  I have reviewed the triage vital signs and the nursing notes.  HISTORY  Chief Complaint Weakness   HPI Diana Ramos is a 35 y.o. femalewho presents to the ED for evaluation of generalized weakness and fever  Chart review indicates history of lupus on Plaquenil.  Patient self-reports that she just finished a 1 week course of Levaquin this past Monday to treat acute otitis media on the right.  She reports as she was finishing this course, on Monday, she developed fevers and has been dealing with intermittent fevers for the past 4 days.  She reports the associated symptoms of generalized weakness as her only complaint.  She reports her rheumatologist just put her on prednisone 2 days ago due to a "lupus flare."  She reports seeing her PCP earlier this week and was diagnosed with a viral syndrome after negative Covid and flu testing.  She reports talking to her PCP again who recommended ED evaluation due to her continued fevers.  Here in the ED, patient reports just feeling tired.  She reports having 3 children at home between the ages of 48 and 45.  She reports that they are all battling bronchitis or respiratory infections.  Past Medical History:  Diagnosis Date  . Anemia   . Lupus ([place])   . Ulcerative colitis ([place])     There are no problems to display for this patient.   Past Surgical History:  Procedure Laterality Date  . BREAST BIOPSY Left 05/30/2020   [place] bx, papilloma, vision marker  . BREAST BIOPSY WITH RADIO FREQUENCY LOCALIZER Left 06/21/2020   Procedure: BREAST BIOPSY WITH RADIO FREQUENCY LOCALIZER;  Surgeon: Benjamine Sprague, DO;  Location: [facility] ORS;  Service: General;  Laterality: Left;  . BREAST EXCISIONAL BIOPSY Left 06/21/2020   papilloma, negative for  atypia and malignancy  . CESAREAN SECTION     x3  . TONSILLECTOMY    . TONSILLECTOMY AND ADENOIDECTOMY      Prior to Admission medications   Medication Sig Start Date End Date Taking? Authorizing Provider  hydroxychloroquine (PLAQUENIL) 200 MG tablet Take 200 mg by mouth daily.   Yes [provider]  predniSONE (DELTASONE) 10 MG tablet Take 10 mg by mouth daily with breakfast.   Yes [provider]  hyoscyamine (LEVSIN SL) 0.125 MG SL tablet Place 0.125 mg under the tongue every 4 (four) hours as needed (IBS).    [provider]  ibuprofen (ADVIL) 800 MG tablet Take 1 tablet (800 mg total) by mouth every 8 (eight) hours as needed for mild pain or moderate pain. 06/21/20   Benjamine Sprague, DO    Allergies Ciprofloxacin  No family history on file.  Social History Social History   Tobacco Use  . Smoking status: Former Research scientist (life sciences)  . Smokeless tobacco: Never Used  Vaping Use  . Vaping Use: Never used  Substance Use Topics  . Alcohol use: Not Currently  . Drug use: Yes    Types: Marijuana    Review of Systems  Constitutional: Positive for generalized weakness and fever. Eyes: No visual changes. ENT: No sore throat. Cardiovascular: Denies chest pain. Respiratory: Denies shortness of breath. Gastrointestinal: No abdominal pain.  No nausea, no vomiting.  No diarrhea.  No constipation. Genitourinary: Negative for dysuria. Musculoskeletal: Negative for back pain. Skin: Negative for rash. Neurological: Negative for headaches,  focal weakness or numbness.  ____________________________________________   PHYSICAL EXAM:  VITAL SIGNS: Vitals:   01/02/21 1244 01/02/21 1553  BP: 125/87 120/88  Pulse: 73 70  Resp: 18 16  Temp:    SpO2: 100% 99%     Constitutional: Alert and oriented. Well appearing and in no acute distress. Eyes: Conjunctivae are normal. PERRL. EOMI. Head: Atraumatic. Nose: No congestion/rhinnorhea. Uvula is midline without evidence of  PTA. Bilateral TMs are visualized without erythema, purulence, bulging or perforation. Mouth/Throat: Mucous membranes are moist.  Oropharynx non-erythematous. Neck: No stridor. No cervical spine tenderness to palpation. Cardiovascular: Normal rate, regular rhythm. Grossly normal heart sounds.  Good peripheral circulation. Respiratory: Normal respiratory effort.  No retractions. Lungs CTAB. Gastrointestinal: Soft , nondistended, nontender to palpation. No CVA tenderness. Musculoskeletal: No lower extremity tenderness nor edema.  No joint effusions. No signs of acute trauma. Neurologic:  Normal speech and language. No gross focal neurologic deficits are appreciated. No gait instability noted. Skin:  Skin is warm, dry and intact. No rash noted. Psychiatric: Mood and affect are normal. Speech and behavior are normal.  ____________________________________________   LABS (all labs ordered are listed, but only abnormal results are displayed)  Labs Reviewed  COMPREHENSIVE METABOLIC PANEL - Abnormal; Notable for the following components:      Result Value   Potassium 3.2 (*)    All other components within normal limits  CBC WITH DIFFERENTIAL/PLATELET - Abnormal; Notable for the following components:   Hemoglobin 10.2 (*)    HCT 33.1 (*)    MCH 25.4 (*)    RDW 16.6 (*)    All other components within normal limits  URINALYSIS, COMPLETE (UACMP) WITH MICROSCOPIC - Abnormal; Notable for the following components:   Color, Urine COLORLESS (*)    APPearance CLEAR (*)    Specific Gravity, Urine 1.002 (*)    Leukocytes,Ua TRACE (*)    All other components within normal limits  LACTIC ACID, PLASMA  LACTIC ACID, PLASMA  POC URINE PREG, ED   ____________________________________________  RADIOLOGY  ED MD interpretation: 2 view CXR reviewed by me without evidence of acute cardiopulmonary pathology.  Official radiology report(s): DG Chest 2 View  Result Date: 01/02/2021 CLINICAL DATA:  Fever.  EXAM: CHEST - 2 VIEW COMPARISON:  None. FINDINGS: The heart size and mediastinal contours are within normal limits. Both lungs are clear. The visualized skeletal structures are unremarkable. IMPRESSION: No active cardiopulmonary disease. Electronically Signed   By: Marijo Conception M.D.   On: 01/02/2021 13:53    ____________________________________________   PROCEDURES and INTERVENTIONS  Procedure(s) performed (including Critical Care):  .1-3 Lead EKG Interpretation Performed by: Vladimir Crofts, MD Authorized by: Vladimir Crofts, MD     Interpretation: normal     ECG rate:  70   ECG rate assessment: normal     Rhythm: sinus rhythm     Ectopy: none     Conduction: normal      Medications  lactated ringers bolus 1,000 mL (0 mLs Intravenous Stopped 01/02/21 1542)  acetaminophen (TYLENOL) tablet 1,000 mg (1,000 mg Oral Given 01/02/21 1336)    ____________________________________________   MDM / ED COURSE    35 year old woman on Plaquenil for lupus presents to the ED with isolated fevers generalized weakness, without evidence of acute pathology, and amenable to outpatient management.  Normal vitals on room air here in the ED.  Exam is reassuring without evidence of acute derangements.  She appears slightly tired, but otherwise well.  No evidence of untreated AOM.  No stigmata of dehydration.  No signs of neurologic or vascular deficits.  Her work-up is reassuring without evidence of sepsis, UTI, CAP or other acute derangements.  I see no barriers to outpatient management, and patient is well connected as an outpatient.  We discussed continue Tylenol dosing for any further fevers, we discussed adherence to her prednisone provided by her rheumatologist, and we discussed return precautions for the ED.     ____________________________________________   FINAL CLINICAL IMPRESSION(S) / ED DIAGNOSES  Final diagnoses:  Generalized weakness  Fever, unspecified fever cause  Lupus [facility])      ED Discharge Orders    None       Thresa Dozier   Note:  This document was prepared using Set designer software and may include unintentional dictation errors.   Vladimir Crofts, MD 01/02/21 [phone]

## 2021-01-02 NOTE — Discharge Instructions (Signed)
As discussed, please follow-up with your primary doctor and her rheumatologist.  Continue your prednisone.  Return to the ED with any significantly worsening symptoms.  Use Tylenol for pain and fevers.  Up to 1000 mg per dose, up to 4 times per day.  Do not take more than 4000 mg of Tylenol/acetaminophen within 24 hours.Marland Kitchen

## 2021-03-28 DIAGNOSIS — M329 Systemic lupus erythematosus, unspecified: Secondary | ICD-10-CM | POA: Insufficient documentation

## 2021-07-21 ENCOUNTER — Encounter: Payer: Self-pay | Admitting: Oncology

## 2021-07-21 ENCOUNTER — Inpatient Hospital Stay: Payer: Managed Care, Other (non HMO) | Attending: Oncology | Admitting: Oncology

## 2021-07-21 ENCOUNTER — Other Ambulatory Visit: Payer: Self-pay

## 2021-07-21 ENCOUNTER — Inpatient Hospital Stay: Payer: Managed Care, Other (non HMO)

## 2021-07-21 VITALS — BP 119/80 | HR 76 | Temp 97.4°F | Resp 18 | Wt 117.5 lb

## 2021-07-21 DIAGNOSIS — D500 Iron deficiency anemia secondary to blood loss (chronic): Secondary | ICD-10-CM | POA: Insufficient documentation

## 2021-07-21 DIAGNOSIS — N920 Excessive and frequent menstruation with regular cycle: Secondary | ICD-10-CM

## 2021-07-21 LAB — COMPREHENSIVE METABOLIC PANEL
ALT: 9 U/L (ref 0–44)
AST: 18 U/L (ref 15–41)
Albumin: 4.6 g/dL (ref 3.5–5.0)
Alkaline Phosphatase: 49 U/L (ref 38–126)
Anion gap: 8 (ref 5–15)
BUN: 9 mg/dL (ref 6–20)
CO2: 29 mmol/L (ref 22–32)
Calcium: 8.7 mg/dL — ABNORMAL LOW (ref 8.9–10.3)
Chloride: 102 mmol/L (ref 98–111)
Creatinine, Ser: 0.83 mg/dL (ref 0.44–1.00)
GFR, Estimated: >60 mL/min (ref >60)
Glucose, Bld: 98 mg/dL (ref 70–99)
Potassium: 3.7 mmol/L (ref 3.5–5.1)
Sodium: 139 mmol/L (ref 135–145)
Total Bilirubin: 0.8 mg/dL (ref 0.3–1.2)
Total Protein: 8.4 g/dL — ABNORMAL HIGH (ref 6.5–8.1)

## 2021-07-21 LAB — CBC WITH DIFFERENTIAL/PLATELET
Abs Immature Granulocytes: 0.02 10*3/uL (ref 0.00–0.07)
Basophils Absolute: 0 10*3/uL (ref 0.0–0.1)
Basophils Relative: 1 %
Eosinophils Absolute: 0 10*3/uL (ref 0.0–0.5)
Eosinophils Relative: 0 %
HCT: 34.7 % — ABNORMAL LOW (ref 36.0–46.0)
Hemoglobin: 10.5 g/dL — ABNORMAL LOW (ref 12.0–15.0)
Immature Granulocytes: 0 %
Lymphocytes Relative: 25 %
Lymphs Abs: 1.3 10*3/uL (ref 0.7–4.0)
MCH: 24.5 pg — ABNORMAL LOW (ref 26.0–34.0)
MCHC: 30.3 g/dL (ref 30.0–36.0)
MCV: 81.1 fL (ref 80.0–100.0)
Monocytes Absolute: 0.4 10*3/uL (ref 0.1–1.0)
Monocytes Relative: 7 %
Neutro Abs: 3.7 10*3/uL (ref 1.7–7.7)
Neutrophils Relative %: 67 %
Platelets: 339 10*3/uL (ref 150–400)
RBC: 4.28 MIL/uL (ref 3.87–5.11)
RDW: 16.3 % — ABNORMAL HIGH (ref 11.5–15.5)
WBC: 5.5 10*3/uL (ref 4.0–10.5)
nRBC: 0 % (ref 0.0–0.2)

## 2021-07-21 LAB — RETIC PANEL
Immature Retic Fract: 10.8 % (ref 2.3–15.9)
RBC.: 4.25 MIL/uL (ref 3.87–5.11)
Retic Count, Absolute: 40.8 10*3/uL (ref 19.0–186.0)
Retic Ct Pct: 1 % (ref 0.4–3.1)
Reticulocyte Hemoglobin: 26.2 pg — ABNORMAL LOW (ref >27.9)

## 2021-07-21 LAB — IRON AND TIBC
Iron: 24 ug/dL — ABNORMAL LOW (ref 28–170)
Saturation Ratios: 5 % — ABNORMAL LOW (ref 10.4–31.8)
TIBC: 504 ug/dL — ABNORMAL HIGH (ref 250–450)
UIBC: 480 ug/dL

## 2021-07-21 LAB — FERRITIN: Ferritin: 4 ng/mL — ABNORMAL LOW (ref 11–307)

## 2021-07-21 LAB — TECHNOLOGIST SMEAR REVIEW
Plt Morphology: ADEQUATE
RBC MORPHOLOGY: NORMAL
WBC MORPHOLOGY: NORMAL

## 2021-07-21 NOTE — Addendum Note (Signed)
Addended by: Earlie Server on: 07/21/2021 11:18 PM   Modules accepted: Orders

## 2021-07-21 NOTE — Progress Notes (Signed)
Hematology/Oncology Consult note [facility] [phone] [phone]   Patient Care Team: Gae Bon, NP as PCP - General (Nurse Practitioner)  REFERRING PROVIDER: Quintin Alto, MD  CHIEF COMPLAINTS/REASON FOR VISIT:  Evaluation of anemia  HISTORY OF PRESENTING ILLNESS:   Diana Ramos is a  35 y.o.  female with PMH listed below was seen in consultation at the request of  Quintin Alto, MD  for evaluation of anemia.   Chronic anemia, 07/01/2021 cbc showed Hb of 9.6, mcv 80.4.  Her previous blood work results with her PCP were not available to me.  Patient reports that she can not tolerate oral iron supplementation.   History of ulcerarive colitis, patient reports that symptoms are controlled, no black or bloody stool.  Patient has lupus and follows up with Dr.Patel.  On Plaquenil.   She has heavy menstrual periods. She has seen Gyn and is not interested in surgical options. She can not tolerate oral contraceptives due to developing skin rash.   + fatigue, + craving for ice chips  Review of Systems  Constitutional:  Positive for fatigue. Negative for appetite change, chills and fever.  HENT:   Negative for hearing loss and voice change.   Eyes:  Negative for eye problems.  Respiratory:  Negative for chest tightness and cough.   Cardiovascular:  Negative for chest pain.  Gastrointestinal:  Negative for abdominal distention, abdominal pain and blood in stool.  Endocrine: Negative for hot flashes.  Genitourinary:  Positive for menstrual problem. Negative for difficulty urinating and frequency.   Musculoskeletal:  Negative for arthralgias.  Skin:  Negative for itching and rash.  Neurological:  Negative for extremity weakness.  Hematological:  Negative for adenopathy.  Psychiatric/Behavioral:  Negative for confusion.    MEDICAL HISTORY:  Past Medical History:  Diagnosis Date   Anemia    Lupus ([place])    Ulcerative  colitis ([place])     SURGICAL HISTORY: Past Surgical History:  Procedure Laterality Date   BREAST BIOPSY Left 05/30/2020   [place] bx, papilloma, vision marker   BREAST BIOPSY WITH RADIO FREQUENCY LOCALIZER Left 06/21/2020   Procedure: BREAST BIOPSY WITH RADIO FREQUENCY LOCALIZER;  Surgeon: Benjamine Sprague, DO;  Location: [facility] ORS;  Service: General;  Laterality: Left;   BREAST EXCISIONAL BIOPSY Left 06/21/2020   papilloma, negative for atypia and malignancy   BREAST LUMPECTOMY Left    10/ 2021   CESAREAN SECTION     x3   TONSILLECTOMY     TONSILLECTOMY AND ADENOIDECTOMY      SOCIAL HISTORY: Social History   Socioeconomic History   Marital status: Married    Spouse name: Not on file   Number of children: Not on file   Years of education: Not on file   Highest education level: Not on file  Occupational History   Not on file  Tobacco Use   Smoking status: Former    Packs/day: 0.50    Years: 3.00    Pack years: 1.50    Types: Cigarettes    Quit date: 2007    Years since quitting: 15.8   Smokeless tobacco: Never  Vaping Use   Vaping Use: Never used  Substance and Sexual Activity   Alcohol use: Not Currently   Drug use: Not Currently    Types: Marijuana   Sexual activity: Not on file  Other Topics Concern   Not on file  Social History Narrative   Not on file   Social Determinants of  Health   Financial Resource Strain: Not on file  Food Insecurity: Not on file  Transportation Needs: Not on file  Physical Activity: Not on file  Stress: Not on file  Social Connections: Not on file  Intimate Partner Violence: Not on file    FAMILY HISTORY: Family History  Problem Relation Age of Onset   Hashimoto's thyroiditis Mother    Atrial fibrillation Father    Heart disease Father    Melanoma Brother    Atrial fibrillation Maternal Grandmother    Lung cancer Maternal Grandmother    Bladder Cancer Maternal Grandmother    Lung cancer Maternal Grandfather     ALLERGIES:  is  allergic to ciprofloxacin.  MEDICATIONS:  Current Outpatient Medications  Medication Sig Dispense Refill   cyclobenzaprine (FLEXERIL) 5 MG tablet Take 5 mg by mouth 3 (three) times daily as needed for muscle spasms (for jaw clenching).     fluticasone (FLONASE) 50 MCG/ACT nasal spray Place 1 spray into both nostrils daily.     hydroxychloroquine (PLAQUENIL) 200 MG tablet Take 200 mg by mouth daily.     montelukast (SINGULAIR) 10 MG tablet Take 10 mg by mouth daily.     vitamin C (ASCORBIC ACID) 500 MG tablet Take 500 mg by mouth daily.     VITAMIN D PO Take 1 tablet by mouth daily.     hyoscyamine (LEVSIN SL) 0.125 MG SL tablet Place 0.125 mg under the tongue every 4 (four) hours as needed (IBS). (Patient not taking: Reported on 07/21/2021)     predniSONE (DELTASONE) 10 MG tablet Take 10 mg by mouth daily with breakfast. (Patient not taking: Reported on 07/21/2021)     No current facility-administered medications for this visit.     PHYSICAL EXAMINATION: ECOG PERFORMANCE STATUS: 1 - Symptomatic but completely ambulatory Vitals:   07/21/21 1509  BP: 119/80  Pulse: 76  Resp: 18  Temp: (!) 97.4 F (36.3 C)   Filed Weights   07/21/21 1509  Weight: 117 lb 8 oz (53.3 kg)    Physical Exam Constitutional:      General: She is not in acute distress. HENT:     Head: Normocephalic and atraumatic.  Eyes:     General: No scleral icterus. Cardiovascular:     Rate and Rhythm: Normal rate and regular rhythm.     Heart sounds: Normal heart sounds.  Pulmonary:     Effort: Pulmonary effort is normal. No respiratory distress.     Breath sounds: No wheezing.  Abdominal:     General: Bowel sounds are normal. There is no distension.     Palpations: Abdomen is soft.  Musculoskeletal:        General: No deformity. Normal range of motion.     Cervical back: Normal range of motion and neck supple.  Skin:    General: Skin is warm and dry.     Coloration: Skin is pale.     Findings: No  erythema or rash.  Neurological:     Mental Status: She is alert and oriented to person, place, and time. Mental status is at baseline.     Cranial Nerves: No cranial nerve deficit.     Coordination: Coordination normal.  Psychiatric:        Mood and Affect: Mood normal.    LABORATORY DATA:  I have reviewed the data as listed Lab Results  Component Value Date   WBC 5.5 07/21/2021   HGB 10.5 (L) 07/21/2021   HCT 34.7 (L) 07/21/2021  MCV 81.1 07/21/2021   PLT 339 07/21/2021   Recent Labs    01/02/21 1339 07/21/21 1549  NA 141 139  K 3.2* 3.7  CL 105 102  CO2 26 29  GLUCOSE 86 98  BUN 11 9  CREATININE 0.78 0.83  CALCIUM 9.1 8.7*  GFRNONAA >60 >60  PROT 7.8 8.4*  ALBUMIN 4.0 4.6  AST 18 18  ALT 8 9  ALKPHOS 40 49  BILITOT 0.6 0.8   Iron/TIBC/Ferritin/ %Sat    Component Value Date/Time   IRON 24 (L) 07/21/2021 1549   TIBC 504 (H) 07/21/2021 1549   FERRITIN 4 (L) 07/21/2021 1549   IRONPCTSAT 5 (L) 07/21/2021 1549      RADIOGRAPHIC STUDIES: I have personally reviewed the radiological images as listed and agreed with the findings in the report. No results found.    ASSESSMENT & PLAN:  1. Iron deficiency anemia due to chronic blood loss   2. Menorrhagia with regular cycle    Anemia, MCV is borderline low.  Suspect iron deficiency. Check cbc cmp smear, iron tibc ferritin, retic panel.  Discussed about IV venofer option.  Allergy reactions/infusion reaction including anaphylactic reaction discussed with patient. Other side effects include but not limited to high blood pressure, skin rash, weight gain, leg swelling, etc. Patient voices understanding and willing to proceed. She has bilateral tubal ligation.  Cbc showed hb of 10.5, ferritin is 4, iron saturation 5 Plan IV iron with Venofer 251m weekly x 4 doses.   Menorrhagia follow up with Gyn   Orders Placed This Encounter  Procedures   Technologist smear review    Standing Status:   Future    Number of  Occurrences:   1    Standing Expiration Date:   07/21/2022   Comprehensive metabolic panel    Standing Status:   Future    Number of Occurrences:   1    Standing Expiration Date:   07/21/2022   CBC with Differential/Platelet    Standing Status:   Future    Number of Occurrences:   1    Standing Expiration Date:   07/21/2022   Ferritin    Standing Status:   Future    Number of Occurrences:   1    Standing Expiration Date:   01/18/2022   Iron and TIBC    Standing Status:   Future    Number of Occurrences:   1    Standing Expiration Date:   07/21/2022   Retic Panel    Standing Status:   Future    Number of Occurrences:   1    Standing Expiration Date:   07/21/2022    All questions were answered. The patient knows to call the clinic with any problems questions or concerns.   PQuintin Alto MD    Return of visit:  Thank you for this kind referral and the opportunity to participate in the care of this patient. A copy of today's note is routed to referring provider    ZEarlie Server MD, PhD [facility]ematology Oncology [facility] 07/21/2021

## 2021-07-21 NOTE — Progress Notes (Signed)
Pt here to establish care.

## 2021-07-22 ENCOUNTER — Telehealth: Payer: Self-pay

## 2021-07-22 NOTE — Telephone Encounter (Signed)
Pt informed. Please schedule appts as requested by MD and inform pt of appt. Venofer will be *new*. Thanks

## 2021-07-22 NOTE — Telephone Encounter (Signed)
Patient scheduled.  Will get approval from employer and confirm appointments.

## 2021-07-22 NOTE — Telephone Encounter (Signed)
-----   Message from Earlie Server, MD sent at 07/21/2021 11:17 PM EDT ----- Please let her know that she has iron deficency anemia.  Recommend IV venofer weekly x 4. Follow up in 3 months, labs prior - ordered, MD +/- venofer

## 2021-07-28 ENCOUNTER — Inpatient Hospital Stay: Payer: Managed Care, Other (non HMO) | Attending: Oncology

## 2021-07-28 ENCOUNTER — Other Ambulatory Visit: Payer: Self-pay

## 2021-07-28 VITALS — BP 113/65 | HR 80 | Temp 97.6°F | Resp 18

## 2021-07-28 DIAGNOSIS — D500 Iron deficiency anemia secondary to blood loss (chronic): Secondary | ICD-10-CM | POA: Insufficient documentation

## 2021-07-28 DIAGNOSIS — N920 Excessive and frequent menstruation with regular cycle: Secondary | ICD-10-CM | POA: Diagnosis present

## 2021-07-28 MED ORDER — SODIUM CHLORIDE 0.9 % IV SOLN: 200.0000 mg | Freq: Once | INTRAVENOUS | Status: DC

## 2021-07-28 MED ORDER — SODIUM CHLORIDE 0.9 % IV SOLN
Freq: Once | INTRAVENOUS | Status: AC
  Filled 2021-07-28: qty 250

## 2021-07-28 MED ORDER — IRON SUCROSE 20 MG/ML IV SOLN
200.0000 mg | Freq: Once | INTRAVENOUS | Status: AC
  Administered 2021-07-28: 200 mg via INTRAVENOUS
  Filled 2021-07-28: qty 10

## 2021-07-28 NOTE — Patient Instructions (Signed)
[facility]  Discharge Instructions: Thank you for choosing [facility] to provide your oncology and hematology care.  If you have a lab appointment with the [facility], please go directly to the [facility] and check in at the registration area.  Wear comfortable clothing and clothing appropriate for easy access to any Portacath or PICC line.   We strive to give you quality time with your provider. You may need to reschedule your appointment if you arrive late (15 or more minutes).  Arriving late affects you and other patients whose appointments are after yours.  Also, if you miss three or more appointments without notifying the office, you may be dismissed from the clinic at the provider's discretion.      For prescription refill requests, have your pharmacy contact our office and allow 72 hours for refills to be completed.    Today you received the following chemotherapy and/or immunotherapy agents VENOFER      To help prevent nausea and vomiting after your treatment, we encourage you to take your nausea medication as directed.  BELOW ARE SYMPTOMS THAT SHOULD BE REPORTED IMMEDIATELY: *FEVER GREATER THAN 100.4 F (38 C) OR HIGHER *CHILLS OR SWEATING *NAUSEA AND VOMITING THAT IS NOT CONTROLLED WITH YOUR NAUSEA MEDICATION *UNUSUAL SHORTNESS OF BREATH *UNUSUAL BRUISING OR BLEEDING *URINARY PROBLEMS (pain or burning when urinating, or frequent urination) *BOWEL PROBLEMS (unusual diarrhea, constipation, pain near the anus) TENDERNESS IN MOUTH AND THROAT WITH OR WITHOUT PRESENCE OF ULCERS (sore throat, sores in mouth, or a toothache) UNUSUAL RASH, SWELLING OR PAIN  UNUSUAL VAGINAL DISCHARGE OR ITCHING   Items with * indicate a potential emergency and should be followed up as soon as possible or go to the Emergency Department if any problems should occur.  Please show the CHEMOTHERAPY ALERT CARD or IMMUNOTHERAPY ALERT CARD at check-in to  the Emergency Department and triage nurse.  Should you have questions after your visit or need to cancel or reschedule your appointment, please contact [facility]  [phone] and follow the prompts.  Office hours are 8:00 a.m. to 4:30 p.m. Monday - Friday. Please note that voicemails left after 4:00 p.m. may not be returned until the following business day.  We are closed weekends and major holidays. You have access to a nurse at all times for urgent questions. Please call the main number to the clinic [phone] and follow the prompts.  For any non-urgent questions, you may also contact your provider using MyChart. We now offer e-Visits for anyone 4 and older to request care online for non-urgent symptoms. For details visit [email or web address].   Also download the MyChart app! Go to the app store, search "MyChart", open the app, select [facility], and log in with your MyChart username and password.  Due to Covid, a mask is required upon entering the hospital/clinic. If you do not have a mask, one will be given to you upon arrival. For doctor visits, patients may have 1 support person aged 32 or older with them. For treatment visits, patients cannot have anyone with them due to current Covid guidelines and our immunocompromised population.   Iron Sucrose Injection What is this medication? IRON SUCROSE (EYE ern SOO krose) treats low levels of iron (iron deficiency anemia) in people with kidney disease. Iron is a mineral that plays an important role in making red blood cells, which carry oxygen from your lungs to the rest of your body. This medicine may  be used for other purposes; ask your health care provider or pharmacist if you have questions. COMMON BRAND NAME(S): Venofer What should I tell my care team before I take this medication? They need to know if you have any of these conditions: Anemia not caused by low iron levels Heart disease High levels  of iron in the blood Kidney disease Liver disease An unusual or allergic reaction to iron, other medications, foods, dyes, or preservatives Pregnant or trying to get pregnant Breast-feeding How should I use this medication? This medication is for infusion into a vein. It is given in a hospital or clinic setting. Talk to your care team about the use of this medication in children. While this medication may be prescribed for children as young as 2 years for selected conditions, precautions do apply. Overdosage: If you think you have taken too much of this medicine contact a poison control center or emergency room at once. NOTE: This medicine is only for you. Do not share this medicine with others. What if I miss a dose? It is important not to miss your dose. Call your care team if you are unable to keep an appointment. What may interact with this medication? Do not take this medication with any of the following: Deferoxamine Dimercaprol Other iron products This medication may also interact with the following: Chloramphenicol Deferasirox This list may not describe all possible interactions. Give your health care provider a list of all the medicines, herbs, non-prescription drugs, or dietary supplements you use. Also tell them if you smoke, drink alcohol, or use illegal drugs. Some items may interact with your medicine. What should I watch for while using this medication? Visit your care team regularly. Tell your care team if your symptoms do not start to get better or if they get worse. You may need blood work done while you are taking this medication. You may need to follow a special diet. Talk to your care team. Foods that contain iron include: whole grains/cereals, dried fruits, beans, or peas, leafy green vegetables, and organ meats (liver, kidney). What side effects may I notice from receiving this medication? Side effects that you should report to your care team as soon as  possible: Allergic reactions--skin rash, itching, hives, swelling of the face, lips, tongue, or throat Low blood pressure--dizziness, feeling faint or lightheaded, blurry vision Shortness of breath Side effects that usually do not require medical attention (report to your care team if they continue or are bothersome): Flushing Headache Joint pain Muscle pain Nausea Pain, redness, or irritation at injection site This list may not describe all possible side effects. Call your doctor for medical advice about side effects. You may report side effects to FDA at 1-800-FDA-1088. Where should I keep my medication? This medication is given in a hospital or clinic and will not be stored at home. NOTE: This sheet is a summary. It may not cover all possible information. If you have questions about this medicine, talk to your doctor, pharmacist, or health care provider.  2022 Elsevier/Gold Standard (2021-01-31 00:00:00)

## 2021-07-31 ENCOUNTER — Ambulatory Visit (INDEPENDENT_AMBULATORY_CARE_PROVIDER_SITE_OTHER): Payer: Managed Care, Other (non HMO) | Admitting: Dermatology

## 2021-07-31 ENCOUNTER — Other Ambulatory Visit: Payer: Self-pay

## 2021-07-31 DIAGNOSIS — L700 Acne vulgaris: Secondary | ICD-10-CM

## 2021-07-31 DIAGNOSIS — D225 Melanocytic nevi of trunk: Secondary | ICD-10-CM | POA: Diagnosis not present

## 2021-07-31 DIAGNOSIS — L739 Follicular disorder, unspecified: Secondary | ICD-10-CM

## 2021-07-31 DIAGNOSIS — D485 Neoplasm of uncertain behavior of skin: Secondary | ICD-10-CM | POA: Diagnosis not present

## 2021-07-31 DIAGNOSIS — L918 Other hypertrophic disorders of the skin: Secondary | ICD-10-CM | POA: Diagnosis not present

## 2021-07-31 MED ORDER — MUPIROCIN 2 % EX OINT
TOPICAL_OINTMENT | CUTANEOUS | 0 refills | Status: DC
Start: 2021-07-31 — End: 2021-10-06

## 2021-07-31 NOTE — Patient Instructions (Addendum)
Recommend OTC Adapalene (Differin) 0.1% gel or cream apply a pea sized amount to the entire face every night. Topical retinoid medications like tretinoin/Retin-A, adapalene/Differin, tazarotene/Fabior, and Epiduo/Epiduo Forte can cause dryness and irritation when first started. Only apply a pea-sized amount to the entire affected area. Avoid applying it around the eyes, edges of mouth and creases at the nose. If you experience irritation, use a good moisturizer first and/or apply the medicine less often. If you are doing well with the medicine, you can increase how often you use it until you are applying every night. Be careful with sun protection while using this medication as it can make you sensitive to the sun. This medicine should not be used by pregnant women.    If you have any questions or concerns for your doctor, please call our main line at [phone] and press option 4 to reach your doctor's medical assistant. If no one answers, please leave a voicemail as directed and we will return your call as soon as possible. Messages left after 4 pm will be answered the following business day.   You may also send [place] a message via [facility]. We typically respond to MyChart messages within 1-2 business days.  For prescription refills, please ask your pharmacy to contact our office. Our fax number is [phone].  If you have an urgent issue when the clinic is closed that cannot wait until the next business day, you can page your doctor at the number below.    Please note that while we do our best to be available for urgent issues outside of office hours, we are not available 24/7.   If you have an urgent issue and are unable to reach [place], you may choose to seek medical care at your doctor's office, retail clinic, urgent care center, or emergency room.  If you have a medical emergency, please immediately call 911 or go to the emergency department.  Pager Numbers  - Dr. Nehemiah Massed: [phone]  -  Dr. Laurence Ferrari: [phone]  - Dr. Nicole Kindred: [phone]  In the event of inclement weather, please call our main line at [phone] for an update on the status of any delays or closures.  Dermatology Medication Tips: Please keep the boxes that topical medications come in in order to help keep track of the instructions about where and how to use these. Pharmacies typically print the medication instructions only on the boxes and not directly on the medication tubes.   If your medication is too expensive, please contact our office at [phone] option 4 or send [place] a message through [facility].   We are unable to tell what your co-pay for medications will be in advance as this is different depending on your insurance coverage. However, we may be able to find a substitute medication at lower cost or fill out paperwork to get insurance to cover a needed medication.   If a prior authorization is required to get your medication covered by your insurance company, please allow [place] 1-2 business days to complete this process.  Drug prices often vary depending on where the prescription is filled and some pharmacies may offer cheaper prices.  The website www.goodrx.com contains coupons for medications through different pharmacies. The prices here do not account for what the cost may be with help from insurance (it may be cheaper with your insurance), but the website can give you the price if you did not use any insurance.  - You can print the associated coupon and take it with  your prescription to the pharmacy.  - You may also stop by our office during regular business hours and pick up a GoodRx coupon card.  - If you need your prescription sent electronically to a different pharmacy, notify our office through [facility] or by phone at [phone] option 4.

## 2021-07-31 NOTE — Progress Notes (Signed)
New Patient Visit  Subjective  Diana Ramos is a 35 y.o. female who presents for the following: irregular skin lesion (Of the R forehead, pubic area, and R axilla - patient would like them checked today and possibly removed since they bother her. ).  The following portions of the chart were reviewed this encounter and updated as appropriate:   Tobacco  Allergies  Meds  Problems  Med Hx  Surg Hx  Fam Hx      Review of Systems:  No other skin or systemic complaints except as noted in HPI or Assessment and Plan.  Objective  Well appearing patient in no apparent distress; mood and affect are within normal limits.  A focused examination was performed including the face, axilla, and groin. Relevant physical exam findings are noted in the Assessment and Plan.  R forehead 0.4 cm yellow papule.      Right Axilla 0.4 cm brown papule.      suprapubic 0.65 cm brown papule.  Face Trace to 1+ open comedones at face.   Assessment & Plan  Neoplasm of uncertain behavior of skin (3) R forehead  Skin / nail biopsy Type of biopsy: punch   Informed consent: discussed and consent obtained   Patient was prepped and draped in usual sterile fashion: Area prepped with alcohol. Anesthesia: the lesion was anesthetized in a standard fashion   Anesthetic:  1% lidocaine w/ epinephrine 1-100,000 buffered w/ 8.4% NaHCO3 Punch size:  2 mm Suture size:  5-0 Suture type: nylon   Suture removal (days):  7 Hemostasis achieved with: suture and pressure   Outcome: patient tolerated procedure well   Post-procedure details: wound care instructions given   Post-procedure details comment:  Ointment and small bandage applied  mupirocin ointment (BACTROBAN) 2 % Apply to healing wounds and cover with bandage QD until healed.  Specimen 1 - Surgical pathology Differential Diagnosis: D48.5 r/o sebaceous adenoma vs hyperplasia Check Margins: No  Right Axilla  Epidermal / dermal  shaving  Lesion diameter (cm):  0.4 Informed consent: discussed and consent obtained   Patient was prepped and draped in usual sterile fashion: area prepped with alcohol. Anesthesia: the lesion was anesthetized in a standard fashion   Anesthetic:  1% lidocaine w/ epinephrine 1-100,000 buffered w/ 8.4% NaHCO3 Instrument used: scissors   Hemostasis achieved with: pressure, aluminum chloride and electrodesiccation   Outcome: patient tolerated procedure well   Post-procedure details: wound care instructions given   Post-procedure details comment:  Ointment and bandaid applied  Specimen 2 - Surgical pathology Differential Diagnosis: D48.5 r/o irritated skin vs irritated nevus r/o dysplasia Check Margins: No  suprapubic  Epidermal / dermal shaving  Lesion diameter (cm):  0.7 Informed consent: discussed and consent obtained   Timeout: patient name, date of birth, surgical site, and procedure verified   Procedure prep:  Patient was prepped and draped in usual sterile fashion Prep type:  Isopropyl alcohol Anesthesia: the lesion was anesthetized in a standard fashion   Anesthetic:  1% lidocaine w/ epinephrine 1-100,000 buffered w/ 8.4% NaHCO3 Instrument used: flexible razor blade   Hemostasis achieved with: pressure, aluminum chloride and electrodesiccation   Outcome: patient tolerated procedure well   Post-procedure details: sterile dressing applied and wound care instructions given   Dressing type: bandage and petrolatum    Specimen 3 - Surgical pathology Differential Diagnosis: D48.5 irritated nevus r/o dysplasia Check Margins: No  Apply Mupirocin 2% ointment to aa's healing wounds QD until healed.   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 Condition is bothersome to patient. Currently flared.  Sensitive skin  Start adapalene 0.1% OTC nightly  Topical retinoid medications like tretinoin/Retin-A, adapalene/Differin,  tazarotene/Fabior, and Epiduo/Epiduo Forte can cause dryness and irritation when first started. Only apply a pea-sized amount to the entire affected area. Avoid applying it around the eyes, edges of mouth and creases at the nose. If you experience irritation, use a good moisturizer first and/or apply the medicine less often. If you are doing well with the medicine, you can increase how often you use it until you are applying every night. Be careful with sun protection while using this medication as it can make you sensitive to the sun. This medicine should not be used by pregnant women.    Return in about 2 weeks (around 08/14/2021) for suture removal; 2-3 mths for TBSE fhx MM in brother.  Luther Redo, CMA, am acting as scribe for Forest Gleason, MD .  Documentation: I have reviewed the above documentation for accuracy and completeness, and I agree with the above.  Forest Gleason, MD

## 2021-08-01 ENCOUNTER — Encounter: Payer: Self-pay | Admitting: Dermatology

## 2021-08-04 ENCOUNTER — Inpatient Hospital Stay: Payer: Managed Care, Other (non HMO)

## 2021-08-04 ENCOUNTER — Telehealth: Payer: Self-pay

## 2021-08-04 ENCOUNTER — Other Ambulatory Visit: Payer: Self-pay

## 2021-08-04 VITALS — BP 115/62 | HR 72 | Temp 99.0°F | Resp 18

## 2021-08-04 DIAGNOSIS — N920 Excessive and frequent menstruation with regular cycle: Secondary | ICD-10-CM | POA: Diagnosis not present

## 2021-08-04 DIAGNOSIS — D500 Iron deficiency anemia secondary to blood loss (chronic): Secondary | ICD-10-CM

## 2021-08-04 MED ORDER — IRON SUCROSE 20 MG/ML IV SOLN
200.0000 mg | Freq: Once | INTRAVENOUS | Status: AC
  Administered 2021-08-04: 200 mg via INTRAVENOUS
  Filled 2021-08-04: qty 10

## 2021-08-04 MED ORDER — SODIUM CHLORIDE 0.9 % IV SOLN
Freq: Once | INTRAVENOUS | Status: AC
  Filled 2021-08-04: qty 250

## 2021-08-04 MED ORDER — SODIUM CHLORIDE 0.9 % IV SOLN: 200.0000 mg | Freq: Once | INTRAVENOUS | Status: DC

## 2021-08-04 NOTE — Patient Instructions (Signed)

## 2021-08-04 NOTE — Telephone Encounter (Signed)
Pt called, pt was in the office Nov and got sutures on her forehead-punch bx, she can not come in for her suture removal on Nov 30, she will need to come her on Monday, ok rescheduled pt to come in to see a nurse for suture removal on Nov 21 at 1:30, dr Laurence Ferrari do not work on Mondays, pt aware

## 2021-08-05 ENCOUNTER — Encounter: Payer: Self-pay | Admitting: Dermatology

## 2021-08-05 ENCOUNTER — Other Ambulatory Visit: Payer: Self-pay

## 2021-08-05 MED ORDER — HYDROCORTISONE 2.5 % EX OINT: TOPICAL_OINTMENT | Freq: Two times a day (BID) | CUTANEOUS | 0 refills | Status: DC

## 2021-08-05 NOTE — Telephone Encounter (Signed)
Please recommend she can use blister bandaids to be left on for 3 days or she can leave it uncovered and reapply the ointment 3-4 times a day. If she is very irritated, we can send in hydrocortisone 2.5% ointment to use twice a day to the irritated area for up to one week to calm it down faster (avoid applying to the wound).

## 2021-08-05 NOTE — Telephone Encounter (Signed)
Error

## 2021-08-11 ENCOUNTER — Ambulatory Visit (INDEPENDENT_AMBULATORY_CARE_PROVIDER_SITE_OTHER): Payer: Managed Care, Other (non HMO)

## 2021-08-11 ENCOUNTER — Other Ambulatory Visit: Payer: Self-pay

## 2021-08-11 ENCOUNTER — Inpatient Hospital Stay: Payer: Managed Care, Other (non HMO)

## 2021-08-11 VITALS — BP 108/70 | HR 64 | Temp 98.9°F | Resp 18

## 2021-08-11 DIAGNOSIS — D500 Iron deficiency anemia secondary to blood loss (chronic): Secondary | ICD-10-CM

## 2021-08-11 DIAGNOSIS — N920 Excessive and frequent menstruation with regular cycle: Secondary | ICD-10-CM | POA: Diagnosis not present

## 2021-08-11 DIAGNOSIS — L738 Other specified follicular disorders: Secondary | ICD-10-CM

## 2021-08-11 MED ORDER — SODIUM CHLORIDE 0.9 % IV SOLN: 200.0000 mg | Freq: Once | INTRAVENOUS | Status: DC

## 2021-08-11 MED ORDER — SODIUM CHLORIDE 0.9 % IV SOLN
Freq: Once | INTRAVENOUS | Status: AC
Start: 2021-08-11 — End: 2021-08-11
  Filled 2021-08-11: qty 250

## 2021-08-11 MED ORDER — IRON SUCROSE 20 MG/ML IV SOLN
200.0000 mg | Freq: Once | INTRAVENOUS | Status: AC
  Administered 2021-08-11: 200 mg via INTRAVENOUS
  Filled 2021-08-11: qty 10

## 2021-08-11 NOTE — Progress Notes (Signed)
   Follow-Up Visit   Subjective  Diana Ramos is a 35 y.o. 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 ).   The following portions of the chart were reviewed this encounter and updated as appropriate:       Objective  Well appearing patient in no apparent distress; mood and affect are within normal limits.   Head - Anterior (Face) Suture removal for biopsy proven sebaceous hyperplasia gland.   Incision site is clean, dry and intact   Assessment & Plan  Sebaceous hyperplasia Head - Anterior (Face)  Encounter for Removal of Sutures - Incision site at the right forehead is clean, dry and intact - Wound cleansed, sutures removed, wound cleansed and steri strips applied.  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 - Patient advised to call with any concerns or if they notice any new or changing lesions.   No follow-ups on file.  I, Harriett Sine, CMA, am acting as scribe for [employer].

## 2021-08-11 NOTE — Patient Instructions (Signed)

## 2021-08-11 NOTE — Patient Instructions (Signed)
After Suture Removal  If your medical team has placed Steri-Strips (white adhesive strips covering the surgical site to provide extra support): Keep the area dry until they fall off.  Do not peel them off. Just let them fall off on their own.  If the edges peel up, you can trim them with scissors.   If your team has not placed Steri-Strips: Wash the area daily with soap and water. Then coat the incision site with plain Vaseline and cover with a bandage. Do this daily for 5 days after the sutures are removed. After that, no additional wound care is generally needed.  However, if you would like to help fade the scar, you can apply a silicone scar cream, gel or sheet every night. The scar will remodel for one year after the procedure. If a skin cancer was removed, be sure to keep your appointment with your dermatologist for follow-up and let your dermatology team know if you have any new or changing spots between visits.    Please call our office at [phone] for any questions or concerns.

## 2021-08-18 ENCOUNTER — Other Ambulatory Visit: Payer: Self-pay

## 2021-08-18 ENCOUNTER — Inpatient Hospital Stay: Payer: Managed Care, Other (non HMO)

## 2021-08-18 VITALS — BP 111/69 | HR 72

## 2021-08-18 DIAGNOSIS — N920 Excessive and frequent menstruation with regular cycle: Secondary | ICD-10-CM | POA: Diagnosis not present

## 2021-08-18 DIAGNOSIS — D500 Iron deficiency anemia secondary to blood loss (chronic): Secondary | ICD-10-CM

## 2021-08-18 MED ORDER — SODIUM CHLORIDE 0.9 % IV SOLN
Freq: Once | INTRAVENOUS | Status: AC
  Filled 2021-08-18: qty 250

## 2021-08-18 MED ORDER — IRON SUCROSE 20 MG/ML IV SOLN
200.0000 mg | Freq: Once | INTRAVENOUS | Status: AC
  Administered 2021-08-18: 16:00:00 200 mg via INTRAVENOUS
  Filled 2021-08-18: qty 10

## 2021-08-18 MED ORDER — SODIUM CHLORIDE 0.9 % IV SOLN: 200.0000 mg | Freq: Once | INTRAVENOUS | Status: DC

## 2021-08-20 ENCOUNTER — Ambulatory Visit: Payer: Managed Care, Other (non HMO) | Admitting: Dermatology

## 2021-09-23 ENCOUNTER — Telehealth: Payer: Self-pay | Admitting: Oncology

## 2021-09-23 NOTE — Telephone Encounter (Signed)
Pt called to reschedule her appt for 2-1 &2-6. She would like to come in earlier. Call back at [phone]

## 2021-09-25 ENCOUNTER — Telehealth: Payer: Self-pay | Admitting: Oncology

## 2021-09-25 NOTE — Telephone Encounter (Signed)
Pt has called again to reschedule her appts for 2-1 & 2-6. Call back at [phone] or [phone]

## 2021-09-26 ENCOUNTER — Telehealth: Payer: Self-pay | Admitting: Oncology

## 2021-09-26 NOTE — Telephone Encounter (Signed)
Left Vm for pt letting her know that we do not have any availability for the rest of Jan. I explained that if there any symptoms or concerns that she would like to discuss with a nurse or any messages we can relay to the MD, to call us back.

## 2021-09-26 NOTE — Telephone Encounter (Signed)
Pt has called again to reschedule her appt. Call back at [phone] this morning.

## 2021-10-02 ENCOUNTER — Ambulatory Visit: Payer: Managed Care, Other (non HMO) | Admitting: Dermatology

## 2021-10-04 ENCOUNTER — Other Ambulatory Visit: Payer: Self-pay | Admitting: Dermatology

## 2021-10-04 DIAGNOSIS — D485 Neoplasm of uncertain behavior of skin: Secondary | ICD-10-CM

## 2021-10-21 ENCOUNTER — Other Ambulatory Visit: Payer: Self-pay | Admitting: Sports Medicine

## 2021-10-21 DIAGNOSIS — M25552 Pain in left hip: Secondary | ICD-10-CM

## 2021-10-21 DIAGNOSIS — M7062 Trochanteric bursitis, left hip: Secondary | ICD-10-CM

## 2021-10-21 DIAGNOSIS — M76892 Other specified enthesopathies of left lower limb, excluding foot: Secondary | ICD-10-CM

## 2021-10-21 DIAGNOSIS — M65252 Calcific tendinitis, left thigh: Secondary | ICD-10-CM

## 2021-10-22 ENCOUNTER — Other Ambulatory Visit: Payer: Managed Care, Other (non HMO)

## 2021-10-23 ENCOUNTER — Other Ambulatory Visit: Payer: Self-pay | Admitting: *Deleted

## 2021-10-23 DIAGNOSIS — D500 Iron deficiency anemia secondary to blood loss (chronic): Secondary | ICD-10-CM

## 2021-10-27 ENCOUNTER — Ambulatory Visit: Payer: Managed Care, Other (non HMO) | Admitting: Oncology

## 2021-10-27 ENCOUNTER — Ambulatory Visit: Payer: Managed Care, Other (non HMO)

## 2021-10-31 ENCOUNTER — Inpatient Hospital Stay: Payer: Managed Care, Other (non HMO) | Attending: Oncology

## 2021-10-31 ENCOUNTER — Other Ambulatory Visit: Payer: Self-pay

## 2021-10-31 DIAGNOSIS — D500 Iron deficiency anemia secondary to blood loss (chronic): Secondary | ICD-10-CM | POA: Diagnosis not present

## 2021-10-31 DIAGNOSIS — N920 Excessive and frequent menstruation with regular cycle: Secondary | ICD-10-CM | POA: Insufficient documentation

## 2021-10-31 LAB — CBC WITH DIFFERENTIAL/PLATELET
Abs Immature Granulocytes: 0 10*3/uL (ref 0.00–0.07)
Basophils Absolute: 0 10*3/uL (ref 0.0–0.1)
Basophils Relative: 1 %
Eosinophils Absolute: 0.1 10*3/uL (ref 0.0–0.5)
Eosinophils Relative: 1 %
HCT: 37.6 % (ref 36.0–46.0)
Hemoglobin: 12.4 g/dL (ref 12.0–15.0)
Immature Granulocytes: 0 %
Lymphocytes Relative: 29 %
Lymphs Abs: 1.1 10*3/uL (ref 0.7–4.0)
MCH: 29.5 pg (ref 26.0–34.0)
MCHC: 33 g/dL (ref 30.0–36.0)
MCV: 89.3 fL (ref 80.0–100.0)
Monocytes Absolute: 0.3 10*3/uL (ref 0.1–1.0)
Monocytes Relative: 8 %
Neutro Abs: 2.3 10*3/uL (ref 1.7–7.7)
Neutrophils Relative %: 61 %
Platelets: 253 10*3/uL (ref 150–400)
RBC: 4.21 MIL/uL (ref 3.87–5.11)
RDW: 13.6 % (ref 11.5–15.5)
WBC: 3.8 10*3/uL — ABNORMAL LOW (ref 4.0–10.5)
nRBC: 0 % (ref 0.0–0.2)

## 2021-10-31 LAB — IRON AND TIBC
Iron: 70 ug/dL (ref 28–170)
Saturation Ratios: 20 % (ref 10.4–31.8)
TIBC: 357 ug/dL (ref 250–450)
UIBC: 287 ug/dL

## 2021-10-31 LAB — RETIC PANEL
Immature Retic Fract: 7.5 % (ref 2.3–15.9)
RBC.: 4.19 MIL/uL (ref 3.87–5.11)
Retic Count, Absolute: 39.4 10*3/uL (ref 19.0–186.0)
Retic Ct Pct: 0.9 % (ref 0.4–3.1)
Reticulocyte Hemoglobin: 33.5 pg (ref >27.9)

## 2021-10-31 LAB — FERRITIN: Ferritin: 64 ng/mL (ref 11–307)

## 2021-11-27 ENCOUNTER — Inpatient Hospital Stay: Payer: Managed Care, Other (non HMO) | Admitting: Oncology

## 2021-11-27 ENCOUNTER — Inpatient Hospital Stay: Payer: Managed Care, Other (non HMO)

## 2021-12-04 ENCOUNTER — Telehealth: Payer: Self-pay | Admitting: *Deleted

## 2021-12-04 NOTE — Telephone Encounter (Signed)
LVM for pt her appt is 3/28 not 3/21 so called and told her if she needs to r/s that appt we can do it. Gave our number for her to call back. ?

## 2021-12-04 NOTE — Telephone Encounter (Signed)
Patient called and left vm on scheduling. She is requesting to r/s her md/venofer apt on Tuesday 3/21. Please return her phone call. Per patient. Ok to leave detailed vm on her phone re: her apt r/s date. ?

## 2021-12-16 ENCOUNTER — Ambulatory Visit: Payer: Managed Care, Other (non HMO)

## 2021-12-16 ENCOUNTER — Ambulatory Visit: Payer: Managed Care, Other (non HMO) | Admitting: Oncology

## 2021-12-23 ENCOUNTER — Inpatient Hospital Stay: Payer: Managed Care, Other (non HMO)

## 2021-12-23 ENCOUNTER — Inpatient Hospital Stay: Payer: Managed Care, Other (non HMO) | Attending: Oncology | Admitting: Oncology

## 2021-12-23 ENCOUNTER — Encounter: Payer: Self-pay | Admitting: Oncology

## 2021-12-23 VITALS — BP 107/72 | HR 80

## 2021-12-23 VITALS — BP 111/72 | HR 85 | Temp 98.7°F | Resp 20 | Wt 127.1 lb

## 2021-12-23 DIAGNOSIS — Z8049 Family history of malignant neoplasm of other genital organs: Secondary | ICD-10-CM | POA: Insufficient documentation

## 2021-12-23 DIAGNOSIS — M2550 Pain in unspecified joint: Secondary | ICD-10-CM | POA: Insufficient documentation

## 2021-12-23 DIAGNOSIS — D500 Iron deficiency anemia secondary to blood loss (chronic): Secondary | ICD-10-CM | POA: Diagnosis present

## 2021-12-23 DIAGNOSIS — Z87891 Personal history of nicotine dependence: Secondary | ICD-10-CM | POA: Diagnosis not present

## 2021-12-23 DIAGNOSIS — Z8349 Family history of other endocrine, nutritional and metabolic diseases: Secondary | ICD-10-CM | POA: Diagnosis not present

## 2021-12-23 DIAGNOSIS — Z8249 Family history of ischemic heart disease and other diseases of the circulatory system: Secondary | ICD-10-CM | POA: Diagnosis not present

## 2021-12-23 DIAGNOSIS — R5383 Other fatigue: Secondary | ICD-10-CM | POA: Insufficient documentation

## 2021-12-23 DIAGNOSIS — N920 Excessive and frequent menstruation with regular cycle: Secondary | ICD-10-CM | POA: Insufficient documentation

## 2021-12-23 DIAGNOSIS — Z808 Family history of malignant neoplasm of other organs or systems: Secondary | ICD-10-CM | POA: Insufficient documentation

## 2021-12-23 DIAGNOSIS — E538 Deficiency of other specified B group vitamins: Secondary | ICD-10-CM | POA: Insufficient documentation

## 2021-12-23 DIAGNOSIS — Z79899 Other long term (current) drug therapy: Secondary | ICD-10-CM | POA: Diagnosis not present

## 2021-12-23 DIAGNOSIS — Z801 Family history of malignant neoplasm of trachea, bronchus and lung: Secondary | ICD-10-CM | POA: Diagnosis not present

## 2021-12-23 DIAGNOSIS — R233 Spontaneous ecchymoses: Secondary | ICD-10-CM | POA: Insufficient documentation

## 2021-12-23 MED ORDER — IRON SUCROSE 20 MG/ML IV SOLN
200.0000 mg | Freq: Once | INTRAVENOUS | Status: AC
  Administered 2021-12-23: 200 mg via INTRAVENOUS
  Filled 2021-12-23: qty 10

## 2021-12-23 MED ORDER — SODIUM CHLORIDE 0.9 % IV SOLN
Freq: Once | INTRAVENOUS | Status: AC
  Filled 2021-12-23: qty 250

## 2021-12-23 MED ORDER — SODIUM CHLORIDE 0.9 % IV SOLN: 200.0000 mg | Freq: Once | INTRAVENOUS | Status: DC

## 2021-12-23 NOTE — Progress Notes (Signed)
Patient states she can feel when her iron is low because she starts to get dizzy. Patient also states her arms get numb at night.  ?

## 2021-12-23 NOTE — Patient Instructions (Signed)

## 2021-12-23 NOTE — Progress Notes (Signed)
?Hematology/Oncology Consult note ?[facility] ?[phone] [phone] ? ? ?Patient Care Team: ?Gae Bon, NP as PCP - General (Nurse Practitioner) ?Earlie Server, MD as Consulting Physician (Hematology and Oncology) ? ?REFERRING PROVIDER: ?Gae Bon, NP  ?CHIEF COMPLAINTS/REASON FOR VISIT:  ?Follow-up for iron deficiency anemia. ? ?HISTORY OF PRESENTING ILLNESS:  ? ?Diana Ramos is a  36 y.o.  female with PMH listed below was seen in consultation at the request of  Gae Bon, NP  for evaluation of anemia.  ? ?Chronic anemia, 07/01/2021 cbc showed Hb of 9.6, mcv 80.4.  ?Her previous blood work results with her PCP were not available to me.  ?Patient reports that she can not tolerate oral iron supplementation.  ? ?History of ulcerarive colitis, patient reports that symptoms are controlled, no black or bloody stool.  ?Patient has lupus and follows up with Dr.Patel.  ?On Plaquenil.  ? ?She has heavy menstrual periods. She has seen Gyn and is not interested in surgical options. She can not tolerate oral contraceptives due to developing skin rash.  ? ?+ fatigue, + craving for ice chips ? ? ?INTERVAL HISTORY ?Diana Ramos is a 36 y.o. female who has above history reviewed by me today presents for follow up visit for management of iron deficiency anemia. ?Patient has previously received IV Venofer treatments and tolerates well ?She had a blood work done in February which showed normal hemoglobin and iron panel.  Recent today she had additional blood work done with primary care provider.  Her scanned lab showed a decreased iron saturation of 8.  Normal hemoglobin. ?She continues to have heavy menstrual bleeding.  B12 was low.  152 ?Patient follows up with rheumatology for lupus.  ESR is normal.  Patient is on Plaquenil and Imuran ? ?Review of Systems  ?Constitutional:  Positive for fatigue. Negative for appetite change, chills and fever.  ?HENT:    Negative for hearing loss and voice change.   ?Eyes:  Negative for eye problems.  ?Respiratory:  Negative for chest tightness and cough.   ?Cardiovascular:  Negative for chest pain.  ?Gastrointestinal:  Negative for abdominal distention, abdominal pain and blood in stool.  ?Endocrine: Negative for hot flashes.  ?Genitourinary:  Positive for menstrual problem. Negative for difficulty urinating and frequency.   ?Musculoskeletal:  Positive for arthralgias.  ?Skin:  Negative for itching and rash.  ?     Petechial rash after applying blood pressure cuff  ?Neurological:  Negative for extremity weakness.  ?Hematological:  Negative for adenopathy.  ?Psychiatric/Behavioral:  Negative for confusion.   ? ?MEDICAL HISTORY:  ?Past Medical History:  ?Diagnosis Date  ? Anemia   ? Lupus ([place])   ? Ulcerative colitis ([place])   ? ? ?SURGICAL HISTORY: ?Past Surgical History:  ?Procedure Laterality Date  ? BREAST BIOPSY Left 05/30/2020  ? [place] bx, papilloma, vision marker  ? BREAST BIOPSY WITH RADIO FREQUENCY LOCALIZER Left 06/21/2020  ? Procedure: BREAST BIOPSY WITH RADIO FREQUENCY LOCALIZER;  Surgeon: Benjamine Sprague, DO;  Location: [facility] ORS;  Service: General;  Laterality: Left;  ? BREAST EXCISIONAL BIOPSY Left 06/21/2020  ? papilloma, negative for atypia and malignancy  ? BREAST LUMPECTOMY Left   ? 10/ 2021  ? CESAREAN SECTION    ? x3  ? TONSILLECTOMY    ? TONSILLECTOMY AND ADENOIDECTOMY    ? TUBAL LIGATION Bilateral   ? ? ?SOCIAL HISTORY: ?Social History  ? ?Socioeconomic History  ? Marital status: Married  ?  Spouse name:  Not on file  ? Number of children: Not on file  ? Years of education: Not on file  ? Highest education level: Not on file  ?Occupational History  ? Not on file  ?Tobacco Use  ? Smoking status: Former  ?  Packs/day: 0.50  ?  Years: 3.00  ?  Pack years: 1.50  ?  Types: Cigarettes  ?  Quit date: 2007  ?  Years since quitting: 16.2  ? Smokeless tobacco: Never  ?Vaping Use  ? Vaping Use: Never used  ?Substance and Sexual  Activity  ? Alcohol use: Not Currently  ? Drug use: Not Currently  ?  Types: Marijuana  ? Sexual activity: Not on file  ?Other Topics Concern  ? Not on file  ?Social History Narrative  ? Not on file  ? ?Social Determinants of Health  ? ?Financial Resource Strain: Not on file  ?Food Insecurity: Not on file  ?Transportation Needs: Not on file  ?Physical Activity: Not on file  ?Stress: Not on file  ?Social Connections: Not on file  ?Intimate Partner Violence: Not on file  ? ? ?FAMILY HISTORY: ?Family History  ?Problem Relation Age of Onset  ? Hashimoto's thyroiditis Mother   ? Atrial fibrillation Father   ? Heart disease Father   ? Melanoma Brother   ? Atrial fibrillation Maternal Grandmother   ? Lung cancer Maternal Grandmother   ? Bladder Cancer Maternal Grandmother   ? Lung cancer Maternal Grandfather   ? ? ?ALLERGIES:  is allergic to ciprofloxacin. ? ?MEDICATIONS:  ?Current Outpatient Medications  ?Medication Sig Dispense Refill  ? cyclobenzaprine (FLEXERIL) 5 MG tablet Take 5 mg by mouth 3 (three) times daily as needed for muscle spasms (for jaw clenching).    ? fluticasone (FLONASE) 50 MCG/ACT nasal spray Place 1 spray into both nostrils daily.    ? hydrocortisone 2.5 % ointment Apply topically 2 (two) times daily. To affected area for up to 1 week 20 g 0  ? hydroxychloroquine (PLAQUENIL) 200 MG tablet Take 200 mg by mouth daily.    ? hyoscyamine (LEVSIN SL) 0.125 MG SL tablet Place 0.125 mg under the tongue every 4 (four) hours as needed (IBS).    ? montelukast (SINGULAIR) 10 MG tablet Take 10 mg by mouth daily.    ? mupirocin ointment (BACTROBAN) 2 % APPLY TO HEALING WOUNDS AND COVER WITH BANDAGE ONE TIME DAILY UNTIL HEALED (Patient not taking: Reported on 12/23/2021) 22 g 0  ? pantoprazole (PROTONIX) 40 MG tablet Take by mouth. (Patient not taking: Reported on 12/23/2021)    ? predniSONE (DELTASONE) 10 MG tablet Take 10 mg by mouth daily with breakfast. (Patient not taking: No sig reported)    ? vitamin C  (ASCORBIC ACID) 500 MG tablet Take 500 mg by mouth daily. (Patient not taking: Reported on 12/23/2021)    ? VITAMIN D PO Take 1 tablet by mouth daily. (Patient not taking: Reported on 12/23/2021)    ? ?No current facility-administered medications for this visit.  ? ? ? ?PHYSICAL EXAMINATION: ? ?Vitals:  ? 12/23/21 1502  ?BP: 111/72  ?Pulse: 85  ?Resp: 20  ?Temp: 98.7 ?F (37.1 ?C)  ?SpO2: 100%  ? ?Filed Weights  ? 12/23/21 1502  ?Weight: 127 lb 1.6 oz (57.7 kg)  ? ? ?Physical Exam ?Constitutional:   ?   General: She is not in acute distress. ?HENT:  ?   Head: Normocephalic and atraumatic.  ?Eyes:  ?   General: No scleral icterus. ?Cardiovascular:  ?  Rate and Rhythm: Normal rate and regular rhythm.  ?   Heart sounds: Normal heart sounds.  ?Pulmonary:  ?   Effort: Pulmonary effort is normal. No respiratory distress.  ?   Breath sounds: No wheezing.  ?Abdominal:  ?   General: Bowel sounds are normal. There is no distension.  ?   Palpations: Abdomen is soft.  ?Musculoskeletal:     ?   General: No deformity. Normal range of motion.  ?   Cervical back: Normal range of motion and neck supple.  ?Skin: ?   General: Skin is warm and dry.  ?   Coloration: Skin is not pale.  ?   Comments: A few area of petechia of right upper extremity after applying blood pressure cuff  ?Neurological:  ?   Mental Status: She is alert and oriented to person, place, and time. Mental status is at baseline.  ?   Cranial Nerves: No cranial nerve deficit.  ?   Coordination: Coordination normal.  ?Psychiatric:     ?   Mood and Affect: Mood normal.  ? ? ?LABORATORY DATA:  ?I have reviewed the data as listed ?Lab Results  ?Component Value Date  ? WBC 3.8 (L) 10/31/2021  ? HGB 12.4 10/31/2021  ? HCT 37.6 10/31/2021  ? MCV 89.3 10/31/2021  ? PLT 253 10/31/2021  ? ?Recent Labs  ?  01/02/21 ?1339 07/21/21 ?1549  ?NA 141 139  ?K 3.2* 3.7  ?CL 105 102  ?CO2 26 29  ?GLUCOSE 86 98  ?BUN 11 9  ?CREATININE 0.78 0.83  ?CALCIUM 9.1 8.7*  ?GFRNONAA >60 >60  ?PROT 7.8  8.4*  ?ALBUMIN 4.0 4.6  ?AST 18 18  ?ALT 8 9  ?ALKPHOS 40 49  ?BILITOT 0.6 0.8  ? ? ?Iron/TIBC/Ferritin/ %Sat ?   ?Component Value Date/Time  ? IRON 70 10/31/2021 0750  ? TIBC 357 10/31/2021 0750  ? FERRITIN 64 02/

## 2022-01-08 ENCOUNTER — Inpatient Hospital Stay: Payer: Managed Care, Other (non HMO)

## 2022-01-08 VITALS — BP 111/76 | HR 76 | Temp 97.8°F | Resp 18

## 2022-01-08 DIAGNOSIS — D500 Iron deficiency anemia secondary to blood loss (chronic): Secondary | ICD-10-CM

## 2022-01-08 MED ORDER — SODIUM CHLORIDE 0.9 % IV SOLN: 200.0000 mg | Freq: Once | INTRAVENOUS | Status: DC

## 2022-01-08 MED ORDER — SODIUM CHLORIDE 0.9 % IV SOLN
INTRAVENOUS | Status: DC | PRN
  Filled 2022-01-08: qty 250

## 2022-01-08 MED ORDER — IRON SUCROSE 20 MG/ML IV SOLN
200.0000 mg | Freq: Once | INTRAVENOUS | Status: AC
  Administered 2022-01-08: 200 mg via INTRAVENOUS
  Filled 2022-01-08: qty 10

## 2022-04-09 ENCOUNTER — Emergency Department: Payer: Managed Care, Other (non HMO)

## 2022-04-09 ENCOUNTER — Other Ambulatory Visit: Payer: Self-pay

## 2022-04-09 ENCOUNTER — Encounter: Payer: Self-pay | Admitting: Emergency Medicine

## 2022-04-09 ENCOUNTER — Other Ambulatory Visit: Payer: Self-pay | Admitting: Family Medicine

## 2022-04-09 ENCOUNTER — Emergency Department
Admission: EM | Admit: 2022-04-09 | Discharge: 2022-04-09 | Disposition: A | Discharge status: Home / Self Care | Payer: Managed Care, Other (non HMO) | Attending: Emergency Medicine | Admitting: Emergency Medicine

## 2022-04-09 ENCOUNTER — Emergency Department
Admission: RE | Admit: 2022-04-09 | Discharge: 2022-04-09 | Disposition: A | Discharge status: Home / Self Care | Payer: Managed Care, Other (non HMO) | Source: Ambulatory Visit | Attending: Family Medicine | Admitting: Family Medicine

## 2022-04-09 DIAGNOSIS — R10829 Rebound abdominal tenderness, unspecified site: Secondary | ICD-10-CM

## 2022-04-09 DIAGNOSIS — N83201 Unspecified ovarian cyst, right side: Secondary | ICD-10-CM | POA: Diagnosis not present

## 2022-04-09 DIAGNOSIS — R55 Syncope and collapse: Secondary | ICD-10-CM | POA: Diagnosis not present

## 2022-04-09 DIAGNOSIS — N83209 Unspecified ovarian cyst, unspecified side: Secondary | ICD-10-CM

## 2022-04-09 DIAGNOSIS — R61 Generalized hyperhidrosis: Secondary | ICD-10-CM | POA: Insufficient documentation

## 2022-04-09 DIAGNOSIS — R509 Fever, unspecified: Secondary | ICD-10-CM

## 2022-04-09 DIAGNOSIS — R109 Unspecified abdominal pain: Secondary | ICD-10-CM | POA: Insufficient documentation

## 2022-04-09 DIAGNOSIS — R1031 Right lower quadrant pain: Secondary | ICD-10-CM | POA: Diagnosis present

## 2022-04-09 LAB — URINALYSIS, ROUTINE W REFLEX MICROSCOPIC
Bilirubin Urine: NEGATIVE
Glucose, UA: NEGATIVE mg/dL
Hgb urine dipstick: NEGATIVE
Ketones, ur: 20 mg/dL — AB
Leukocytes,Ua: NEGATIVE
Nitrite: NEGATIVE
Protein, ur: NEGATIVE mg/dL
Specific Gravity, Urine: 1.004 — ABNORMAL LOW (ref 1.005–1.030)
pH: 7 (ref 5.0–8.0)

## 2022-04-09 LAB — COMPREHENSIVE METABOLIC PANEL
ALT: 13 U/L (ref 0–44)
AST: 19 U/L (ref 15–41)
Albumin: 4.6 g/dL (ref 3.5–5.0)
Alkaline Phosphatase: 43 U/L (ref 38–126)
Anion gap: 10 (ref 5–15)
BUN: 9 mg/dL (ref 6–20)
CO2: 24 mmol/L (ref 22–32)
Calcium: 9.5 mg/dL (ref 8.9–10.3)
Chloride: 105 mmol/L (ref 98–111)
Creatinine, Ser: 0.71 mg/dL (ref 0.44–1.00)
GFR, Estimated: >60 mL/min (ref >60)
Glucose, Bld: 86 mg/dL (ref 70–99)
Potassium: 3.7 mmol/L (ref 3.5–5.1)
Sodium: 139 mmol/L (ref 135–145)
Total Bilirubin: 0.8 mg/dL (ref 0.3–1.2)
Total Protein: 8.4 g/dL — ABNORMAL HIGH (ref 6.5–8.1)

## 2022-04-09 LAB — CBC
HCT: 40.5 % (ref 36.0–46.0)
Hemoglobin: 13.2 g/dL (ref 12.0–15.0)
MCH: 29.3 pg (ref 26.0–34.0)
MCHC: 32.6 g/dL (ref 30.0–36.0)
MCV: 89.8 fL (ref 80.0–100.0)
Platelets: 250 10*3/uL (ref 150–400)
RBC: 4.51 MIL/uL (ref 3.87–5.11)
RDW: 12.4 % (ref 11.5–15.5)
WBC: 4.6 10*3/uL (ref 4.0–10.5)
nRBC: 0 % (ref 0.0–0.2)

## 2022-04-09 LAB — TROPONIN I (HIGH SENSITIVITY): Troponin I (High Sensitivity): <2 ng/L (ref <18)

## 2022-04-09 LAB — POC URINE PREG, ED: Preg Test, Ur: NEGATIVE

## 2022-04-09 LAB — LIPASE, BLOOD: Lipase: 34 U/L (ref 11–51)

## 2022-04-09 MED ORDER — DIPHENHYDRAMINE HCL 50 MG/ML IJ SOLN
25.0000 mg | Freq: Once | INTRAMUSCULAR | Status: AC
  Administered 2022-04-09: 25 mg via INTRAVENOUS
  Filled 2022-04-09: qty 1

## 2022-04-09 MED ORDER — MORPHINE SULFATE (PF) 4 MG/ML IV SOLN
4.0000 mg | Freq: Once | INTRAVENOUS | Status: AC
  Administered 2022-04-09: 4 mg via INTRAVENOUS
  Filled 2022-04-09: qty 1

## 2022-04-09 MED ORDER — ONDANSETRON HCL 4 MG/2ML IJ SOLN
4.0000 mg | Freq: Once | INTRAMUSCULAR | Status: AC
  Administered 2022-04-09: 4 mg via INTRAVENOUS
  Filled 2022-04-09: qty 2

## 2022-04-09 MED ORDER — SODIUM CHLORIDE 0.9 % IV BOLUS
1000.0000 mL | Freq: Once | INTRAVENOUS | Status: AC
  Administered 2022-04-09: 1000 mL via INTRAVENOUS

## 2022-04-09 MED ORDER — ONDANSETRON 4 MG PO TBDP: 4.0000 mg | ORAL_TABLET | Freq: Three times a day (TID) | ORAL | 0 refills | Status: DC | PRN

## 2022-04-09 NOTE — ED Provider Notes (Signed)
[facility] Provider Note    Event Date/Time   First MD Initiated Contact with Patient 04/09/22 1710     (approximate)   History   Chief Complaint Abdominal Pain   HPI  Diana Ramos is a 36 y.o. female with past medical history of ulcerative colitis, lupus, and anemia who presents to the ED complaining of abdominal pain.  Patient reports that she has been dealing with pain in the right lower quadrant of her abdomen wrapping around to her right lower back for the past 3 days.  Pain has been constant but waxing and waning in severity, not exacerbated or alleviated by anything in particular.  She has been feeling nauseous with decreased oral intake, but denies any vomiting or diarrhea.  She has not had any dysuria or hematuria, denies any vaginal bleeding or discharge.  Her LMP was earlier this month, states it was more painful with heavier bleeding than she is used to.  She was initially evaluated at her PCPs office, had CT scan performed at that time that showed right adnexal cyst but no other acute process.  She felt dizzy and lightheaded at her PCPs office, did have a brief syncopal episode.  She denies any associated chest pain or shortness of breath.  She was referred by her PCP to the ED for further evaluation.     Physical Exam   Triage Vital Signs: ED Triage Vitals  Enc Vitals Group     BP 04/09/22 1550 125/75     Pulse Rate 04/09/22 1550 69     Resp 04/09/22 1550 16     Temp 04/09/22 1550 99.3 F (37.4 C)     Temp Source 04/09/22 1550 Oral     SpO2 04/09/22 1550 95 %     Weight 04/09/22 1551 126 lb (57.2 kg)     Height 04/09/22 1551 5' (1.524 m)     Head Circumference --      Peak Flow --      Pain Score 04/09/22 1555 7     Pain Loc --      Pain Edu? --      Excl. in [place]? --     Most recent vital signs: Vitals:   04/09/22 1550  BP: 125/75  Pulse: 69  Resp: 16  Temp: 99.3 F (37.4 C)  SpO2: 95%    Constitutional: Alert and  oriented. Eyes: Conjunctivae are normal. Head: Atraumatic. Nose: No congestion/rhinnorhea. Mouth/Throat: Mucous membranes are moist.  Cardiovascular: Normal rate, regular rhythm. Grossly normal heart sounds.  2+ radial pulses bilaterally. Respiratory: Normal respiratory effort.  No retractions. Lungs CTAB. Gastrointestinal: Soft and tender to palpation in the right lower quadrant with no rebound or guarding.  No CVA tenderness bilaterally. No distention. Musculoskeletal: No lower extremity tenderness nor edema.  Neurologic:  Normal speech and language. No gross focal neurologic deficits are appreciated.    ED Results / Procedures / Treatments   Labs (all labs ordered are listed, but only abnormal results are displayed) Labs Reviewed  COMPREHENSIVE METABOLIC PANEL - Abnormal; Notable for the following components:      Result Value   Total Protein 8.4 (*)    All other components within normal limits  URINALYSIS, ROUTINE W REFLEX MICROSCOPIC - Abnormal; Notable for the following components:   Color, Urine STRAW (*)    APPearance HAZY (*)    Specific Gravity, Urine 1.004 (*)    Ketones, ur 20 (*)    All other components within  normal limits  LIPASE, BLOOD  CBC  POC URINE PREG, ED  TROPONIN I (HIGH SENSITIVITY)     EKG  ED ECG REPORT I, Blake Divine, the attending physician, personally viewed and interpreted this ECG.   Date: 04/09/2022  EKG Time: 16:09  Rate: 75  Rhythm: normal sinus rhythm  Axis: Normal  Intervals:none  ST&T Change: None  RADIOLOGY Chest x-ray reviewed and interpreted by me with no infiltrate, edema, or effusion.  PROCEDURES:  Critical Care performed: No  Procedures   MEDICATIONS ORDERED IN ED: Medications  morphine (PF) 4 MG/ML injection 4 mg (4 mg Intravenous Given 04/09/22 1751)  ondansetron (ZOFRAN) injection 4 mg (4 mg Intravenous Given 04/09/22 1749)  sodium chloride 0.9 % bolus 1,000 mL (1,000 mLs Intravenous New Bag/Given 04/09/22  1753)  diphenhydrAMINE (BENADRYL) injection 25 mg (25 mg Intravenous Given 04/09/22 1809)     IMPRESSION / MDM / ASSESSMENT AND PLAN / ED COURSE  I reviewed the triage vital signs and the nursing notes.                              36 y.o. female with past medical history of ulcerative colitis, lupus, and anemia who presents to the ED complaining of 3 days of waxing and waning pain in the right lower quadrant of her abdomen radiating around to her right lower back associated with decreased oral intake and syncopal episode at her PCPs office.  Patient's presentation is most consistent with acute presentation with potential threat to life or bodily function.  Differential diagnosis includes, but is not limited to, ACS, arrhythmia, dehydration, AKI, electrolyte abnormality, appendicitis, ovarian cyst, kidney stone, ovarian torsion, UTI.  Patient well-appearing and in no acute distress, vital signs are unremarkable and EKG shows no evidence of arrhythmia or ischemia.  Suspect syncopal episode is due to dehydration and recent decreased oral intake.  She has no risk factors for cardiac etiology for syncope, troponin is also within normal limits.  Additional labs are reassuring with no significant anemia, leukocytosis, electrolyte abnormality, or AKI.  LFTs within normal limits, pregnancy testing is negative, and urinalysis shows no signs of infection.  CT scan from earlier today was reviewed and remarkable only for cystic structure in the right lower quadrant.  We will check pelvic ultrasound to ensure cystic structure is not resulting in torsion, plan to treat symptomatically with IV morphine, Zofran, and fluids.  Pelvic ultrasound shows right-sided hemorrhagic ovarian cyst with no evidence of torsion.  Patient's pain improved on reassessment and she is appropriate for discharge home with OB/GYN follow-up.  She was counseled to return to the ED for new or worsening symptoms, patient agrees with plan.       FINAL CLINICAL IMPRESSION(S) / ED DIAGNOSES   Final diagnoses:  RLQ abdominal pain  Hemorrhagic ovarian cyst     Rx / DC Orders   ED Discharge Orders     None        Note:  This document was prepared using Dragon voice recognition software and may include unintentional dictation errors.   Blake Divine, MD 04/09/22 [phone]

## 2022-04-09 NOTE — ED Notes (Signed)
Pt reports increased itching and rash that feels like her lupus is flaring up. MD made aware

## 2022-04-09 NOTE — ED Triage Notes (Signed)
Patient sent by her PCP after having CT abdomen showing cystic right adnexal lesion. Patient states she has lupus and has been having right sided abdominal pain x 1 week. Requesting a pelvic ultrasound for further evaluation.

## 2022-04-09 NOTE — ED Provider Triage Note (Signed)
Emergency Medicine Provider Triage Evaluation Note  Diana Ramos , a 36 y.o. female  was evaluated in triage.  Pt complains of right lower quadrant pain, syncope.  Patient has developed right lower quadrant pain, diarrhea, blood in her stool times several days.  Patient then went to see her dentist, had a syncopal episode while in the office.  Patient was sent here for evaluation after she had an outpatient CT which showed a 4.4 cm right adnexal mass/cyst.  Review of Systems  Positive: Right lower quadrant pain, syncope, diarrhea Negative: Headache, vision changes, chest pain, shortness of breath, urinary changes  Physical Exam  BP 125/75 (BP Location: Left Arm)   Pulse 69   Temp 99.3 F (37.4 C) (Oral)   Resp 16   Ht 5' (1.524 m)   Wt 57.2 kg   LMP 03/21/2022   SpO2 95%   BMI 24.61 kg/m  Gen:   Awake, no distress   Resp:  Normal effort  MSK:   Moves extremities without difficulty  Other:  Tender in the right lower quadrant  Medical Decision Making  Medically screening exam initiated at 4:06 PM.  Appropriate orders placed.  [name] was informed that the remainder of the evaluation will be completed by another provider, this initial triage assessment does not replace that evaluation, and the importance of remaining in the ED until their evaluation is complete.  Patient presents to the ED with right lower quadrant pain, 4.4 cm adnexal cyst/mass identified on outpatient CT today as well as a syncopal episode earlier today.  Patient will have labs, imaging at this time.   [name], PA-C 04/09/22 1606

## 2022-04-09 NOTE — ED Notes (Signed)
See triage room via wheelchair. Pt reports epigastric pain and RLQ pain. Pt also endorses N/V and chills. Pt states recent scan showed ovarian cyst that may be causing symptoms.

## 2022-04-15 ENCOUNTER — Ambulatory Visit (INDEPENDENT_AMBULATORY_CARE_PROVIDER_SITE_OTHER): Payer: Managed Care, Other (non HMO) | Admitting: Dermatology

## 2022-04-15 DIAGNOSIS — L738 Other specified follicular disorders: Secondary | ICD-10-CM | POA: Diagnosis not present

## 2022-04-15 DIAGNOSIS — B354 Tinea corporis: Secondary | ICD-10-CM | POA: Diagnosis not present

## 2022-04-15 DIAGNOSIS — Z1283 Encounter for screening for malignant neoplasm of skin: Secondary | ICD-10-CM | POA: Diagnosis not present

## 2022-04-15 DIAGNOSIS — L578 Other skin changes due to chronic exposure to nonionizing radiation: Secondary | ICD-10-CM

## 2022-04-15 DIAGNOSIS — L700 Acne vulgaris: Secondary | ICD-10-CM

## 2022-04-15 DIAGNOSIS — R21 Rash and other nonspecific skin eruption: Secondary | ICD-10-CM

## 2022-04-15 DIAGNOSIS — D229 Melanocytic nevi, unspecified: Secondary | ICD-10-CM

## 2022-04-15 DIAGNOSIS — D1800 Hemangioma unspecified site: Secondary | ICD-10-CM

## 2022-04-15 DIAGNOSIS — L821 Other seborrheic keratosis: Secondary | ICD-10-CM

## 2022-04-15 DIAGNOSIS — L814 Other melanin hyperpigmentation: Secondary | ICD-10-CM

## 2022-04-15 MED ORDER — CLINDAMYCIN PHOSPHATE 1 % EX SOLN: CUTANEOUS | 11 refills | Status: DC

## 2022-04-15 MED ORDER — AKLIEF 0.005 % EX CREA: TOPICAL_CREAM | CUTANEOUS | 11 refills | Status: DC

## 2022-04-15 NOTE — Progress Notes (Signed)
Follow-Up Visit   Subjective  Diana Ramos is a 36 y.o. female who presents for the following: Annual Exam (1 year tbse, reports family history of melanoma in younger brother, has a few spots she would like checked at scalp, under right arm, and right forehead. Would also like to discuss acne at chin. Currently using otc Differin gel but not helping. ).  The patient presents for Total-Body Skin Exam (TBSE) for skin cancer screening and mole check.  The patient has spots, moles and lesions to be evaluated, some may be new or changing and the patient has concerns that these could be cancer.  The following portions of the chart were reviewed this encounter and updated as appropriate:  Tobacco  Allergies  Meds  Problems  Med Hx  Surg Hx  Fam Hx      Review of Systems: No other skin or systemic complaints except as noted in HPI or Assessment and Plan.   Objective  Well appearing patient in no apparent distress; mood and affect are within normal limits.  A full examination was performed including scalp, head, eyes, ears, nose, lips, neck, chest, axillae, abdomen, back, buttocks, bilateral upper extremities, bilateral lower extremities, hands, feet, fingers, toes, fingernails, and toenails. All findings within normal limits unless otherwise noted below.  face Scattered inflammatory papules with trace open comedones at face  Right Forehead Small yellow papules with a central dell.   chest Annular scaly pink patch KOH positive   Assessment & Plan  Acne vulgaris face  Chronic condition with duration or expected duration over one year. Condition is bothersome to patient. Currently flared.   Sensitive skin hx of lupus  D/c adapalene 0.1 % gel  Start Aklief 0.005 % cream apply pea sized amount topically to face every other night, If doing well after 1 - 2 weeks can increase to qd.  [place] 12 rfs  Start clindamycin solution in the am. [employer] 12 rfs  Recommend  [facility] Hypochlorous Spray (found in the wound care section) OR Cln brand Acne or Sports wash. The [facility] Hypochlorous Spray can be sprayed on daily and left on. The Cln wash should be applied to the affected area daily for at least 30 seconds and then rinsed off. If you are using clindamycin solution or lotion or another topical antibiotic to treat acne, using a hypochlorous product may help lower the risk of antibiotic resistant bacteria.   Topical retinoid medications like tretinoin/Retin-A, adapalene/Differin, tazarotene/Fabior, and Epiduo/Epiduo Forte can cause dryness and irritation when first started. Only apply a pea-sized amount to the entire affected area. Avoid applying it around the eyes, edges of mouth and creases at the nose. If you experience irritation, use a good moisturizer first and/or apply the medicine less often. If you are doing well with the medicine, you can increase how often you use it until you are applying every night. Be careful with sun protection while using this medication as it can make you sensitive to the sun. This medicine should not be used by pregnant women.   clindamycin (CLEOCIN T) 1 % external solution - face Apply topically every morning. 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   Discussed can be treated if bothersome but could leave scar, may recur  Patient deferred treatment at this time.   Tinea corporis chest  KOH positive for hyphal elements  Recommend OTC Terbinafine (Lamisil) cream - apply topically bid until clear, and then continue for 1 more week after clear.       Lentigines - Scattered tan macules - Due to sun exposure - Benign-appearing, observe - Recommend daily broad spectrum sunscreen SPF 30+ to sun-exposed areas, reapply every 2 hours as needed. - Call for any changes  Seborrheic  Keratoses - Stuck-on, waxy, tan-brown papules and/or plaques  - Benign-appearing - Discussed benign etiology and prognosis. - Observe - Call for any changes  Melanocytic Nevi - Tan-brown and/or pink-flesh-colored symmetric macules and papules - Benign appearing on exam today - Observation - Call clinic for new or changing moles - Recommend daily use of broad spectrum spf 30+ sunscreen to sun-exposed areas.   Hemangiomas At scalp  - Red papules - Discussed benign nature - Observe - Call for any changes  Actinic Damage - Chronic condition, secondary to cumulative UV/sun exposure - diffuse scaly erythematous macules with underlying dyspigmentation - Recommend daily broad spectrum sunscreen SPF 30+ to sun-exposed areas, reapply every 2 hours as needed.  - Staying in the shade or wearing long sleeves, sun glasses (UVA+UVB protection) and wide brim hats (4-inch brim around the entire circumference of the hat) are also recommended for sun protection.  - Call for new or changing lesions.  Skin cancer screening performed today. Return for 3 - 4 month acne , 1 year tbse . I, Ruthell Rummage, CMA, am acting as scribe for Forest Gleason, MD.  Documentation: I have reviewed the above documentation for accuracy and completeness, and I agree with the above.  Forest Gleason, MD

## 2022-04-15 NOTE — Patient Instructions (Addendum)
For red scaly patch at chest or other area  Start otc terbinafine (Lamisil) cream - apply twice daily  and then 1 week after clear       For Acne   Start Aklief 0.005 % cream -  apply pea sized amount to face every other night with moisturizer. If doing well after a 1 - 2 weeks can increase to nightly   Start clindamycin solution in the am. Pair with Cln acne wash, PanOyxl 4% creamy wash or [facility] hypochlorous spray to prevent antibiotic resistance.  Recommend [facility] Hypochlorous Spray (found in the wound care section) OR Cln brand Acne or Sports wash. The [facility] Hypochlorous Spray can be sprayed on daily and left on. The Cln wash should be applied to the affected area daily for at least 30 seconds and then rinsed off. If you are using clindamycin solution or lotion or another topical antibiotic to treat acne, using a hypochlorous product may help lower the risk of antibiotic resistant bacteria.   Recommend using Cln Acne Wash daily, leave on for 1-2 minutes before rinsing off. This can be purchased at [employer] or online.  Topical retinoid medications like tretinoin/Retin-A, adapalene/Differin, tazarotene/Fabior, and Epiduo/Epiduo Forte can cause dryness and irritation when first started. Only apply a pea-sized amount to the entire affected area. Avoid applying it around the eyes, edges of mouth and creases at the nose. If you experience irritation, use a good moisturizer first and/or apply the medicine less often. If you are doing well with the medicine, you can increase how often you use it until you are applying every night. Be careful with sun protection while using this medication as it can make you sensitive to the sun. This medicine should not be used by pregnant women.     Recommend taking Heliocare sun protection supplement daily in sunny weather for additional sun protection. For maximum protection on the sunniest days, you can take up to 2 capsules of regular  Heliocare OR take 1 capsule of Heliocare Ultra. For prolonged exposure (such as a full day in the sun), you can repeat your dose of the supplement 4 hours after your first dose. Heliocare can be purchased at [employer], at some [facility] or at [email or web address].     Melanoma ABCDEs  Melanoma is the most dangerous type of skin cancer, and is the leading cause of death from skin disease.  You are more likely to develop melanoma if you: Have light-colored skin, light-colored eyes, or red or blond hair Spend a lot of time in the sun Tan regularly, either outdoors or in a tanning bed Have had blistering sunburns, especially during childhood Have a close family member who has had a melanoma Have atypical moles or large birthmarks  Early detection of melanoma is key since treatment is typically straightforward and cure rates are extremely high if we catch it early.   The first sign of melanoma is often a change in a mole or a new dark spot.  The ABCDE system is a way of remembering the signs of melanoma.  A for asymmetry:  The two halves do not match. B for border:  The edges of the growth are irregular. C for color:  A mixture of colors are present instead of an even brown color. D for diameter:  Melanomas are usually (but not always) greater than 3m - the size of a pencil eraser. E for evolution:  The spot keeps changing in size, shape, and color.  Please check  your skin once per month between visits. You can use a small mirror in front and a large mirror behind you to keep an eye on the back side or your body.   If you see any new or changing lesions before your next follow-up, please call to schedule a visit.  Please continue daily skin protection including broad spectrum sunscreen SPF 30+ to sun-exposed areas, reapplying every 2 hours as needed when you're outdoors.   Staying in the shade or wearing long sleeves, sun glasses (UVA+UVB protection) and wide brim hats (4-inch brim  around the entire circumference of the hat) are also recommended for sun protection.    Due to recent changes in healthcare laws, you may see results of your pathology and/or laboratory studies on MyChart before the doctors have had a chance to review them. We understand that in some cases there may be results that are confusing or concerning to you. Please understand that not all results are received at the same time and often the doctors may need to interpret multiple results in order to provide you with the best plan of care or course of treatment. Therefore, we ask that you please give [place] 2 business days to thoroughly review all your results before contacting the office for clarification. Should we see a critical lab result, you will be contacted sooner.   If You Need Anything After Your Visit  If you have any questions or concerns for your doctor, please call our main line at [phone] and press option 4 to reach your doctor's medical assistant. If no one answers, please leave a voicemail as directed and we will return your call as soon as possible. Messages left after 4 pm will be answered the following business day.   You may also send [place] a message via [place]. We typically respond to MyChart messages within 1-2 business days.  For prescription refills, please ask your pharmacy to contact our office. Our fax number is [phone].  If you have an urgent issue when the clinic is closed that cannot wait until the next business day, you can page your doctor at the number below.    Please note that while we do our best to be available for urgent issues outside of office hours, we are not available 24/7.   If you have an urgent issue and are unable to reach [place], you may choose to seek medical care at your doctor's office, retail clinic, urgent care center, or emergency room.  If you have a medical emergency, please immediately call 911 or go to the emergency department.  Pager Numbers  -  Dr. Nehemiah Massed: [phone]  - Dr. Laurence Ferrari: [phone]  - Dr. Nicole Kindred: [phone]  In the event of inclement weather, please call our main line at [phone] for an update on the status of any delays or closures.  Dermatology Medication Tips: Please keep the boxes that topical medications come in in order to help keep track of the instructions about where and how to use these. Pharmacies typically print the medication instructions only on the boxes and not directly on the medication tubes.   If your medication is too expensive, please contact our office at [phone] option 4 or send [place] a message through [place].   We are unable to tell what your co-pay for medications will be in advance as this is different depending on your insurance coverage. However, we may be able to find a substitute medication at lower cost or fill out paperwork to get  insurance to cover a needed medication.   If a prior authorization is required to get your medication covered by your insurance company, please allow [place] 1-2 business days to complete this process.  Drug prices often vary depending on where the prescription is filled and some pharmacies may offer cheaper prices.  The website www.goodrx.com contains coupons for medications through different pharmacies. The prices here do not account for what the cost may be with help from insurance (it may be cheaper with your insurance), but the website can give you the price if you did not use any insurance.  - You can print the associated coupon and take it with your prescription to the pharmacy.  - You may also stop by our office during regular business hours and pick up a GoodRx coupon card.  - If you need your prescription sent electronically to a different pharmacy, notify our office through [facility] or by phone at [phone] option 4.     Si Usted Necesita Algo Despus de Su Visita  Tambin puede enviarnos un mensaje a travs de Pharmacist, community. Por  lo general respondemos a los mensajes de MyChart en el transcurso de 1 a 2 das hbiles.  Para renovar recetas, por favor pida a su farmacia que se ponga en contacto con nuestra oficina. Harland Dingwall de fax es [place] [phone].  Si tiene un asunto urgente cuando la clnica est cerrada y que no puede esperar hasta el siguiente da hbil, puede llamar/localizar a su doctor(a) al nmero que aparece a continuacin.   Por favor, tenga en cuenta que aunque hacemos todo lo posible para estar disponibles para asuntos urgentes fuera del horario de [place], no estamos disponibles las 24 horas del da, los 7 das de la [employer].   Si tiene un problema urgente y no puede comunicarse con nosotros, puede optar por buscar atencin mdica  en el consultorio de su doctor(a), en una clnica privada, en un centro de atencin urgente o en una sala de emergencias.  Si tiene Engineering geologist, por favor llame inmediatamente al 911 o vaya a la sala de emergencias.  Nmeros de bper  - Dr. Nehemiah Massed: [phone]  - Dra. Moye: [phone]  - Dra. Nicole Kindred: [phone]  En caso de inclemencias del [place], por favor llame a Johnsie Kindred principal al [phone] para una actualizacin sobre el [place] de cualquier retraso o cierre.  Consejos para la medicacin en dermatologa: Por favor, guarde las cajas en las que vienen los medicamentos de uso tpico para ayudarle a seguir las instrucciones sobre dnde y cmo usarlos. Las farmacias generalmente imprimen las instrucciones del medicamento slo en las cajas y no directamente en los tubos del [place].   Si su medicamento es muy caro, por favor, pngase en contacto con Zigmund Daniel llamando al [phone] y presione la opcin 4 o envenos un mensaje a travs de Pharmacist, community.   No podemos decirle cul ser su copago por los medicamentos por adelantado ya que esto es diferente dependiendo de la cobertura de su seguro. Sin embargo, es posible que podamos encontrar  un medicamento sustituto a Electrical engineer un formulario para que el seguro cubra el medicamento que se considera necesario.   Si se requiere una autorizacin previa para que su compaa de seguros [place] su medicamento, por favor permtanos de 1 a 2 das hbiles para completar este proceso.  Los precios de los medicamentos varan con frecuencia dependiendo del Environmental consultant de dnde se surte la receta y Environmental health practitioner pueden ofrecer  precios ms baratos.  El sitio web www.goodrx.com tiene cupones para medicamentos de Airline pilot. Los precios aqu no tienen en cuenta lo que podra costar con la ayuda del seguro (puede ser ms barato con su seguro), pero el sitio web puede darle el precio si no utiliz Research scientist (physical sciences).  - Puede imprimir el cupn correspondiente y llevarlo con su receta a la farmacia.  - Tambin puede pasar por nuestra oficina durante el horario de atencin regular y Charity fundraiser una tarjeta de cupones de GoodRx.  - Si necesita que su receta se enve electrnicamente a una farmacia diferente, informe a nuestra oficina a travs de MyChart de [place] o por telfono llamando al [phone] y presione la opcin 4.

## 2022-04-16 ENCOUNTER — Encounter: Payer: Self-pay | Admitting: Dermatology

## 2022-04-20 ENCOUNTER — Encounter: Payer: Self-pay | Admitting: Dermatology

## 2022-06-22 ENCOUNTER — Inpatient Hospital Stay: Payer: Managed Care, Other (non HMO) | Attending: Oncology

## 2022-06-22 DIAGNOSIS — E538 Deficiency of other specified B group vitamins: Secondary | ICD-10-CM | POA: Diagnosis not present

## 2022-06-22 DIAGNOSIS — N920 Excessive and frequent menstruation with regular cycle: Secondary | ICD-10-CM | POA: Insufficient documentation

## 2022-06-22 DIAGNOSIS — D500 Iron deficiency anemia secondary to blood loss (chronic): Secondary | ICD-10-CM | POA: Insufficient documentation

## 2022-06-22 LAB — CBC WITH DIFFERENTIAL/PLATELET
Abs Immature Granulocytes: 0.06 10*3/uL (ref 0.00–0.07)
Basophils Absolute: 0 10*3/uL (ref 0.0–0.1)
Basophils Relative: 0 %
Eosinophils Absolute: 0 10*3/uL (ref 0.0–0.5)
Eosinophils Relative: 0 %
HCT: 37.8 % (ref 36.0–46.0)
Hemoglobin: 12.5 g/dL (ref 12.0–15.0)
Immature Granulocytes: 1 %
Lymphocytes Relative: 13 %
Lymphs Abs: 1.2 10*3/uL (ref 0.7–4.0)
MCH: 29.4 pg (ref 26.0–34.0)
MCHC: 33.1 g/dL (ref 30.0–36.0)
MCV: 88.9 fL (ref 80.0–100.0)
Monocytes Absolute: 0.4 10*3/uL (ref 0.1–1.0)
Monocytes Relative: 5 %
Neutro Abs: 7.3 10*3/uL (ref 1.7–7.7)
Neutrophils Relative %: 81 %
Platelets: 289 10*3/uL (ref 150–400)
RBC: 4.25 MIL/uL (ref 3.87–5.11)
RDW: 12.7 % (ref 11.5–15.5)
WBC: 8.9 10*3/uL (ref 4.0–10.5)
nRBC: 0 % (ref 0.0–0.2)

## 2022-06-22 LAB — VITAMIN B12: Vitamin B-12: 184 pg/mL (ref 180–914)

## 2022-06-22 LAB — FERRITIN: Ferritin: 8 ng/mL — ABNORMAL LOW (ref 11–307)

## 2022-06-22 LAB — IRON AND TIBC
Iron: 67 ug/dL (ref 28–170)
Saturation Ratios: 17 % (ref 10.4–31.8)
TIBC: 388 ug/dL (ref 250–450)
UIBC: 321 ug/dL

## 2022-06-24 ENCOUNTER — Inpatient Hospital Stay (HOSPITAL_BASED_OUTPATIENT_CLINIC_OR_DEPARTMENT_OTHER): Payer: Managed Care, Other (non HMO) | Admitting: Oncology

## 2022-06-24 ENCOUNTER — Inpatient Hospital Stay: Payer: Managed Care, Other (non HMO)

## 2022-06-24 ENCOUNTER — Encounter: Payer: Self-pay | Admitting: Oncology

## 2022-06-24 VITALS — BP 110/76 | HR 72 | Temp 96.5°F | Resp 18

## 2022-06-24 DIAGNOSIS — E538 Deficiency of other specified B group vitamins: Secondary | ICD-10-CM | POA: Diagnosis not present

## 2022-06-24 DIAGNOSIS — D500 Iron deficiency anemia secondary to blood loss (chronic): Secondary | ICD-10-CM

## 2022-06-24 DIAGNOSIS — N920 Excessive and frequent menstruation with regular cycle: Secondary | ICD-10-CM | POA: Diagnosis not present

## 2022-06-24 MED ORDER — SODIUM CHLORIDE 0.9 % IV SOLN
200.0000 mg | Freq: Once | INTRAVENOUS | Status: AC
  Administered 2022-06-24: 200 mg via INTRAVENOUS
  Filled 2022-06-24: qty 200

## 2022-06-24 MED ORDER — SODIUM CHLORIDE 0.9 % IV SOLN
INTRAVENOUS | Status: DC
  Filled 2022-06-24: qty 250

## 2022-06-24 NOTE — Assessment & Plan Note (Addendum)
Labs are reviewed and discussed with patient. Ferritin 8, recommend IV Venofer 200 mg x 3.  Etiology of iron deficiency anemia is likely due to heavy menses as well as ulcerative colitis.

## 2022-06-24 NOTE — Assessment & Plan Note (Signed)
Recommend vitamin B-12 1000 mcg every 2 weeks x 2 followed by monthly.   We will repeat B12 level at the next visit.

## 2022-06-24 NOTE — Progress Notes (Signed)
Hematology/Oncology Progress note [phone] [phone]      Patient Care Team: Peggye Form, NP as PCP - General (Family Medicine) Earlie Server, MD as Consulting Physician (Hematology and Oncology)  ASSESSMENT & PLAN:   Iron deficiency anemia due to chronic blood loss Labs are reviewed and discussed with patient. Ferritin 8, recommend IV Venofer 200 mg x 3.  Etiology of iron deficiency anemia is likely due to heavy menses as well as ulcerative colitis.  Vitamin B12 deficiency Recommend vitamin B-12 1000 mcg every 2 weeks x 2 followed by monthly.   We will repeat B12 level at the next visit.  Orders Placed This Encounter  Procedures   CBC with Differential/Platelet    Standing Status:   Future    Standing Expiration Date:   06/25/2023   Iron and TIBC    Standing Status:   Future    Standing Expiration Date:   06/25/2023   Ferritin    Standing Status:   Future    Standing Expiration Date:   06/25/2023   Vitamin B12    Standing Status:   Future    Standing Expiration Date:   06/25/2023   Follow-up in 6 months. All questions were answered. The patient knows to call the clinic with any problems, questions or concerns.  Earlie Server, MD, PhD [facility] Hematology Oncology 06/24/2022   CHIEF COMPLAINTS/REASON FOR VISIT:  Follow-up for iron deficiency anemia.  HISTORY OF PRESENTING ILLNESS:   Diana Ramos is a  36 y.o.  female with PMH listed below was seen in consultation at the request of  Gae Bon, NP  for evaluation of anemia.   Chronic anemia, 07/01/2021 cbc showed Hb of 9.6, mcv 80.4.  Her previous blood work results with her PCP were not available to me.  Patient reports that she can not tolerate oral iron supplementation.   History of ulcerarive colitis, patient reports that symptoms are controlled, no black or bloody stool.  Patient has lupus and follows up with Dr.Patel.  On Plaquenil.   She has heavy menstrual periods. She  has seen Gyn and is not interested in surgical options. She can not tolerate oral contraceptives due to developing skin rash.   Patient follows up with rheumatology for lupus.  ESR is normal.  Patient is on Plaquenil and Imuran  INTERVAL HISTORY Diana Ramos is a 36 y.o. female who has above history reviewed by me today presents for follow up visit for management of iron deficiency anemia. Patient has previously received IV Venofer treatments and tolerates well Her fatigue improved" worse during recent few weeks. + Heavy menses  Review of Systems  Constitutional:  Positive for fatigue. Negative for appetite change, chills and fever.  HENT:   Negative for hearing loss and voice change.   Eyes:  Negative for eye problems.  Respiratory:  Negative for chest tightness and cough.   Cardiovascular:  Negative for chest pain.  Gastrointestinal:  Negative for abdominal distention, abdominal pain and blood in stool.  Endocrine: Negative for hot flashes.  Genitourinary:  Positive for menstrual problem. Negative for difficulty urinating and frequency.   Musculoskeletal:  Positive for arthralgias.  Skin:  Negative for itching and rash.  Neurological:  Negative for extremity weakness.  Hematological:  Negative for adenopathy.  Psychiatric/Behavioral:  Negative for confusion.     MEDICAL HISTORY:  Past Medical History:  Diagnosis Date   Anemia    Lupus ([place])    Ulcerative colitis ([place])  SURGICAL HISTORY: Past Surgical History:  Procedure Laterality Date   BREAST BIOPSY Left 05/30/2020   [place] bx, papilloma, vision marker   BREAST BIOPSY WITH RADIO FREQUENCY LOCALIZER Left 06/21/2020   Procedure: BREAST BIOPSY WITH RADIO FREQUENCY LOCALIZER;  Surgeon: Benjamine Sprague, DO;  Location: [facility] ORS;  Service: General;  Laterality: Left;   BREAST EXCISIONAL BIOPSY Left 06/21/2020   papilloma, negative for atypia and malignancy   BREAST LUMPECTOMY Left    10/ 2021   CESAREAN SECTION     x3    TONSILLECTOMY     TONSILLECTOMY AND ADENOIDECTOMY     TUBAL LIGATION Bilateral     SOCIAL HISTORY: Social History   Socioeconomic History   Marital status: Married    Spouse name: Not on file   Number of children: Not on file   Years of education: Not on file   Highest education level: Not on file  Occupational History   Not on file  Tobacco Use   Smoking status: Former    Packs/day: 0.50    Years: 3.00    Total pack years: 1.50    Types: Cigarettes    Quit date: 2007    Years since quitting: 16.7   Smokeless tobacco: Never  Vaping Use   Vaping Use: Never used  Substance and Sexual Activity   Alcohol use: Not Currently   Drug use: Not Currently    Types: Marijuana   Sexual activity: Not on file  Other Topics Concern   Not on file  Social History Narrative   Not on file   Social Determinants of Health   Financial Resource Strain: Not on file  Food Insecurity: Not on file  Transportation Needs: Not on file  Physical Activity: Not on file  Stress: Not on file  Social Connections: Not on file  Intimate Partner Violence: Not on file    FAMILY HISTORY: Family History  Problem Relation Age of Onset   Hashimoto's thyroiditis Mother    Atrial fibrillation Father    Heart disease Father    Melanoma Brother    Atrial fibrillation Maternal Grandmother    Lung cancer Maternal Grandmother    Bladder Cancer Maternal Grandmother    Lung cancer Maternal Grandfather     ALLERGIES:  is allergic to ciprofloxacin.  MEDICATIONS:  Current Outpatient Medications  Medication Sig Dispense Refill   clindamycin (CLEOCIN T) 1 % external solution Apply topically every morning. For face 30 mL 11   cyclobenzaprine (FLEXERIL) 5 MG tablet Take 5 mg by mouth 3 (three) times daily as needed for muscle spasms (for jaw clenching).     fluticasone (FLONASE) 50 MCG/ACT nasal spray Place 1 spray into both nostrils daily.     hydroxychloroquine (PLAQUENIL) 200 MG tablet Take 200 mg by  mouth daily.     hyoscyamine (LEVSIN SL) 0.125 MG SL tablet Place 0.125 mg under the tongue every 4 (four) hours as needed (IBS).     levocetirizine (XYZAL) 5 MG tablet Take 1 tablet by mouth every evening.     Trifarotene (AKLIEF) 0.005 % CREA Apply pea sized amount nightly to face 45 g 11   venlafaxine XR (EFFEXOR-XR) 37.5 MG 24 hr capsule Take by mouth.     ALPRAZolam (XANAX) 0.5 MG tablet Take 0.5 mg by mouth daily as needed.     diclofenac Sodium (VOLTAREN) 1 % GEL Apply 1 Application topically 4 (four) times daily.     hydrocortisone 2.5 % ointment Apply topically 2 (two) times daily. To  affected area for up to 1 week (Patient not taking: Reported on 06/24/2022) 20 g 0   montelukast (SINGULAIR) 10 MG tablet Take 10 mg by mouth daily. (Patient not taking: Reported on 06/24/2022)     mupirocin ointment (BACTROBAN) 2 % APPLY TO HEALING WOUNDS AND COVER WITH BANDAGE ONE TIME DAILY UNTIL HEALED (Patient not taking: Reported on 12/23/2021) 22 g 0   ondansetron (ZOFRAN-ODT) 4 MG disintegrating tablet Take 1 tablet (4 mg total) by mouth every 8 (eight) hours as needed for nausea or vomiting. (Patient not taking: Reported on 06/24/2022) 12 tablet 0   pantoprazole (PROTONIX) 40 MG tablet Take by mouth. (Patient not taking: Reported on 12/23/2021)     predniSONE (DELTASONE) 10 MG tablet Take 10 mg by mouth daily with breakfast. (Patient not taking: Reported on 07/21/2021)     vitamin C (ASCORBIC ACID) 500 MG tablet Take 500 mg by mouth daily. (Patient not taking: Reported on 12/23/2021)     VITAMIN D PO Take 1 tablet by mouth daily. (Patient not taking: Reported on 12/23/2021)     No current facility-administered medications for this visit.     PHYSICAL EXAMINATION:  Vitals:   06/24/22 1307  BP: 109/79  Pulse: 72  Resp: 18  Temp: (!) 96.2 F (35.7 C)   Filed Weights   06/24/22 1307  Weight: 125 lb 12.8 oz (57.1 kg)    Physical Exam Constitutional:      General: She is not in acute  distress. HENT:     Head: Normocephalic and atraumatic.  Eyes:     General: No scleral icterus. Cardiovascular:     Rate and Rhythm: Normal rate and regular rhythm.     Heart sounds: Normal heart sounds.  Pulmonary:     Effort: Pulmonary effort is normal. No respiratory distress.     Breath sounds: No wheezing.  Abdominal:     General: Bowel sounds are normal. There is no distension.     Palpations: Abdomen is soft.  Musculoskeletal:        General: No deformity. Normal range of motion.     Cervical back: Normal range of motion and neck supple.  Skin:    General: Skin is warm and dry.     Coloration: Skin is not pale.  Neurological:     Mental Status: She is alert and oriented to person, place, and time. Mental status is at baseline.     Cranial Nerves: No cranial nerve deficit.  Psychiatric:        Mood and Affect: Mood normal.     LABORATORY DATA:  I have reviewed the data as listed    Latest Ref Rng & Units 06/22/2022    2:53 PM 04/09/2022    3:57 PM 10/31/2021    7:50 AM  CBC  WBC 4.0 - 10.5 K/uL 8.9  4.6  3.8   Hemoglobin 12.0 - 15.0 g/dL 12.5  13.2  12.4   Hematocrit 36.0 - 46.0 % 37.8  40.5  37.6   Platelets 150 - 400 K/uL 289  250  253       Latest Ref Rng & Units 04/09/2022    3:57 PM 07/21/2021    3:49 PM 01/02/2021    1:39 PM  CMP  Glucose 70 - 99 mg/dL 86  98  86   BUN 6 - 20 mg/dL 9  9  11    Creatinine 0.44 - 1.00 mg/dL 0.71  0.83  0.78   Sodium 135 - 145 mmol/L  139  139  141   Potassium 3.5 - 5.1 mmol/L 3.7  3.7  3.2   Chloride 98 - 111 mmol/L 105  102  105   CO2 22 - 32 mmol/L 24  29  26    Calcium 8.9 - 10.3 mg/dL 9.5  8.7  9.1   Total Protein 6.5 - 8.1 g/dL 8.4  8.4  7.8   Total Bilirubin 0.3 - 1.2 mg/dL 0.8  0.8  0.6   Alkaline Phos 38 - 126 U/L 43  49  40   AST 15 - 41 U/L 19  18  18    ALT 0 - 44 U/L 13  9  8      Iron/TIBC/Ferritin/ %Sat    Component Value Date/Time   IRON 67 06/22/2022 1453   TIBC 388 06/22/2022 1453   FERRITIN 8 (L)  06/22/2022 1453   IRONPCTSAT 17 06/22/2022 1453       RADIOGRAPHIC STUDIES: I have personally reviewed the radiological images as listed and agreed with the findings in the report. No results found.

## 2022-06-24 NOTE — Patient Instructions (Signed)
[facility] CANCER CTR AT [place]  Discharge Instructions: Thank you for choosing [facility] to provide your oncology and hematology care.  If you have a lab appointment with the [facility], please go directly to the [facility] and check in at the registration area.  Wear comfortable clothing and clothing appropriate for easy access to any Portacath or PICC line.   We strive to give you quality time with your provider. You may need to reschedule your appointment if you arrive late (15 or more minutes).  Arriving late affects you and other patients whose appointments are after yours.  Also, if you miss three or more appointments without notifying the office, you may be dismissed from the clinic at the provider's discretion.      For prescription refill requests, have your pharmacy contact our office and allow 72 hours for refills to be completed.    Today you received the following chemotherapy and/or immunotherapy agents venofer      To help prevent nausea and vomiting after your treatment, we encourage you to take your nausea medication as directed.  BELOW ARE SYMPTOMS THAT SHOULD BE REPORTED IMMEDIATELY: *FEVER GREATER THAN 100.4 F (38 C) OR HIGHER *CHILLS OR SWEATING *NAUSEA AND VOMITING THAT IS NOT CONTROLLED WITH YOUR NAUSEA MEDICATION *UNUSUAL SHORTNESS OF BREATH *UNUSUAL BRUISING OR BLEEDING *URINARY PROBLEMS (pain or burning when urinating, or frequent urination) *BOWEL PROBLEMS (unusual diarrhea, constipation, pain near the anus) TENDERNESS IN MOUTH AND THROAT WITH OR WITHOUT PRESENCE OF ULCERS (sore throat, sores in mouth, or a toothache) UNUSUAL RASH, SWELLING OR PAIN  UNUSUAL VAGINAL DISCHARGE OR ITCHING   Items with * indicate a potential emergency and should be followed up as soon as possible or go to the Emergency Department if any problems should occur.  Please show the CHEMOTHERAPY ALERT CARD or IMMUNOTHERAPY ALERT CARD at check-in to the  Emergency Department and triage nurse.  Should you have questions after your visit or need to cancel or reschedule your appointment, please contact [facility] CANCER [place] AT [place]  [phone] and follow the prompts.  Office hours are 8:00 a.m. to 4:30 p.m. Monday - Friday. Please note that voicemails left after 4:00 p.m. may not be returned until the following business day.  We are closed weekends and major holidays. You have access to a nurse at all times for urgent questions. Please call the main number to the clinic [phone] and follow the prompts.  For any non-urgent questions, you may also contact your provider using MyChart. We now offer e-Visits for anyone 45 and older to request care online for non-urgent symptoms. For details visit [email or web address].   Also download the MyChart app! Go to the app store, search "MyChart", open the app, select [facility], and log in with your MyChart username and password.  Masks are optional in the cancer centers. If you would like for your care team to wear a mask while they are taking care of you, please let them know. For doctor visits, patients may have with them one support person who is at least 37 years old. At this time, visitors are not allowed in the infusion area.

## 2022-07-08 ENCOUNTER — Ambulatory Visit: Payer: Managed Care, Other (non HMO)

## 2022-07-09 ENCOUNTER — Ambulatory Visit: Payer: Managed Care, Other (non HMO)

## 2022-07-09 ENCOUNTER — Inpatient Hospital Stay: Payer: Managed Care, Other (non HMO)

## 2022-07-09 VITALS — BP 110/77 | HR 68 | Temp 95.6°F | Resp 18

## 2022-07-09 DIAGNOSIS — N920 Excessive and frequent menstruation with regular cycle: Secondary | ICD-10-CM | POA: Diagnosis not present

## 2022-07-09 DIAGNOSIS — D500 Iron deficiency anemia secondary to blood loss (chronic): Secondary | ICD-10-CM

## 2022-07-09 MED ORDER — SODIUM CHLORIDE 0.9 % IV SOLN
200.0000 mg | Freq: Once | INTRAVENOUS | Status: AC
  Administered 2022-07-09: 200 mg via INTRAVENOUS
  Filled 2022-07-09: qty 200

## 2022-07-09 MED ORDER — CYANOCOBALAMIN 1000 MCG/ML IJ SOLN
1000.0000 ug | Freq: Once | INTRAMUSCULAR | Status: AC
  Administered 2022-07-09: 1000 ug via INTRAMUSCULAR
  Filled 2022-07-09: qty 1

## 2022-07-09 MED ORDER — SODIUM CHLORIDE 0.9 % IV SOLN
INTRAVENOUS | Status: DC
  Filled 2022-07-09: qty 250

## 2022-07-22 ENCOUNTER — Ambulatory Visit: Payer: Managed Care, Other (non HMO)

## 2022-07-23 ENCOUNTER — Ambulatory Visit: Payer: Managed Care, Other (non HMO) | Admitting: Dermatology

## 2022-07-24 ENCOUNTER — Inpatient Hospital Stay: Payer: Managed Care, Other (non HMO) | Attending: Oncology

## 2022-07-24 VITALS — BP 113/74 | HR 70 | Temp 97.1°F | Resp 16

## 2022-07-24 DIAGNOSIS — D500 Iron deficiency anemia secondary to blood loss (chronic): Secondary | ICD-10-CM | POA: Insufficient documentation

## 2022-07-24 DIAGNOSIS — N920 Excessive and frequent menstruation with regular cycle: Secondary | ICD-10-CM | POA: Diagnosis not present

## 2022-07-24 DIAGNOSIS — E538 Deficiency of other specified B group vitamins: Secondary | ICD-10-CM | POA: Insufficient documentation

## 2022-07-24 MED ORDER — SODIUM CHLORIDE 0.9 % IV SOLN
INTRAVENOUS | Status: DC | PRN
  Filled 2022-07-24: qty 250

## 2022-07-24 MED ORDER — CYANOCOBALAMIN 1000 MCG/ML IJ SOLN
1000.0000 ug | Freq: Once | INTRAMUSCULAR | Status: AC
  Administered 2022-07-24: 1000 ug via INTRAMUSCULAR
  Filled 2022-07-24: qty 1

## 2022-07-24 MED ORDER — SODIUM CHLORIDE 0.9 % IV SOLN
200.0000 mg | Freq: Once | INTRAVENOUS | Status: AC
  Administered 2022-07-24: 200 mg via INTRAVENOUS
  Filled 2022-07-24: qty 200

## 2022-08-21 ENCOUNTER — Inpatient Hospital Stay: Payer: Managed Care, Other (non HMO)

## 2022-09-22 ENCOUNTER — Inpatient Hospital Stay: Payer: Managed Care, Other (non HMO)

## 2022-10-21 ENCOUNTER — Ambulatory Visit (INDEPENDENT_AMBULATORY_CARE_PROVIDER_SITE_OTHER): Payer: Managed Care, Other (non HMO) | Admitting: Dermatology

## 2022-10-21 ENCOUNTER — Encounter: Payer: Self-pay | Admitting: Dermatology

## 2022-10-21 VITALS — BP 107/67 | HR 74

## 2022-10-21 DIAGNOSIS — L738 Other specified follicular disorders: Secondary | ICD-10-CM | POA: Diagnosis not present

## 2022-10-21 DIAGNOSIS — L821 Other seborrheic keratosis: Secondary | ICD-10-CM | POA: Diagnosis not present

## 2022-10-21 DIAGNOSIS — L700 Acne vulgaris: Secondary | ICD-10-CM

## 2022-10-21 DIAGNOSIS — D1801 Hemangioma of skin and subcutaneous tissue: Secondary | ICD-10-CM

## 2022-10-21 NOTE — Progress Notes (Signed)
   Follow-Up Visit   Subjective  Diana Ramos is a 37 y.o. female who presents for the following: Acne (3 - 4 month acne follow up. Patient is currently using clindamycin solution in the morning and adapalene 0.1 gel nightly. Reports decrease breakouts using current treatments. ) and Other (Patient reports a irritated bump at right posterior scalp near neck she would like checked. ).  The following portions of the chart were reviewed this encounter and updated as appropriate:  Tobacco  Allergies  Meds  Problems  Med Hx  Surg Hx  Fam Hx      Review of Systems: No other skin or systemic complaints except as noted in HPI or Assessment and Plan.   Objective  Well appearing patient in no apparent distress; mood and affect are within normal limits.  A focused examination was performed including face, right posterior scalp. Relevant physical exam findings are noted in the Assessment and Plan.  Head - Anterior (Face) Rare open comedones    Assessment & Plan  Acne vulgaris Head - Anterior (Face)  Chronic condition with duration or expected duration over one year. Currently well-controlled but with side effect (irritation) of medication.  Continue adapalene 0.1% gel OTC nightly. Recommend washing face with gentle cleanser when she gets home and applying moisturizer to damp skin. Before bed, apply moisturizer again, then pea sized amount of adapalene, then moisturizer again. Can use 100% argan oil or aquaphor to face before bed. Continue clindamycin solution in the am.   Continue Cln products (hypochlorous acid)   Discussed alternative cream treatments instead of clindamycin solution in future if still irritated.   Topical retinoid medications like tretinoin/Retin-A, adapalene/Differin, tazarotene/Fabior, and Epiduo/Epiduo Forte can cause dryness and irritation when first started. Only apply a pea-sized amount to the entire affected area. Avoid applying it around the eyes, edges of  mouth and creases at the nose. If you experience irritation, use a good moisturizer first and/or apply the medicine less often. If you are doing well with the medicine, you can increase how often you use it until you are applying every night. Be careful with sun protection while using this medication as it can make you sensitive to the sun. This medicine should not be used by pregnant women.   Related Medications clindamycin (CLEOCIN T) 1 % external solution Apply topically every morning. For face  Trifarotene (AKLIEF) 0.005 % CREA Apply pea sized amount nightly to face   Sebaceous Hyperplasia At left cheek, forehead, and bx proven right forehead - Small yellow papules with a central dell - Benign - Observe  Seborrheic Keratoses At right cheek - Stuck-on, waxy, tan-brown papules and/or plaques  - Benign-appearing - Discussed benign etiology and prognosis. - Observe - Call for any changes  Hemangiomas Right posterior scalp discussed could remove if bothers. Patient deferred treatment - Red papules - Discussed benign nature - Observe - Call for any changes   Return in about 1 year (around 10/22/2023) for acne.  I, Ruthell Rummage, CMA, am acting as scribe for Forest Gleason, MD.  Documentation: I have reviewed the above documentation for accuracy and completeness, and I agree with the above.  Forest Gleason, MD

## 2022-10-21 NOTE — Patient Instructions (Addendum)
Continue clindamycin solution to face in morning  Try using moisturizer before and after applying adapalene gel at night.   Recommended non-comedogenic (non-acne causing) facial oils include 100% argan oil or squalane. The can be used after applying any recommended creams or ointments to the skin in the evening. The Ordinary Brand has a high-quality and affordable version of both of these and can be found at [place].     Topical retinoid medications like tretinoin/Retin-A, adapalene/Differin, tazarotene/Fabior, and Epiduo/Epiduo Forte can cause dryness and irritation when first started. Only apply a pea-sized amount to the entire affected area. Avoid applying it around the eyes, edges of mouth and creases at the nose. If you experience irritation, use a good moisturizer first and/or apply the medicine less often. If you are doing well with the medicine, you can increase how often you use it until you are applying every night. Be careful with sun protection while using this medication as it can make you sensitive to the sun. This medicine should not be used by pregnant women.     Due to recent changes in healthcare laws, you may see results of your pathology and/or laboratory studies on MyChart before the doctors have had a chance to review them. We understand that in some cases there may be results that are confusing or concerning to you. Please understand that not all results are received at the same time and often the doctors may need to interpret multiple results in order to provide you with the best plan of care or course of treatment. Therefore, we ask that you please give [place] 2 business days to thoroughly review all your results before contacting the office for clarification. Should we see a critical lab result, you will be contacted sooner.   If You Need Anything After Your Visit  If you have any questions or concerns for your doctor, please call our main line at [phone] and  press option 4 to reach your doctor's medical assistant. If no one answers, please leave a voicemail as directed and we will return your call as soon as possible. Messages left after 4 pm will be answered the following business day.   You may also send [place] a message via [place]. We typically respond to MyChart messages within 1-2 business days.  For prescription refills, please ask your pharmacy to contact our office. Our fax number is [phone].  If you have an urgent issue when the clinic is closed that cannot wait until the next business day, you can page your doctor at the number below.    Please note that while we do our best to be available for urgent issues outside of office hours, we are not available 24/7.   If you have an urgent issue and are unable to reach [place], you may choose to seek medical care at your doctor's office, retail clinic, urgent care center, or emergency room.  If you have a medical emergency, please immediately call 911 or go to the emergency department.  Pager Numbers  - Dr. Nehemiah Massed: [phone]  - Dr. Laurence Ferrari: [phone]  - Dr. Nicole Kindred: [phone]  In the event of inclement weather, please call our main line at [phone] for an update on the status of any delays or closures.  Dermatology Medication Tips: Please keep the boxes that topical medications come in in order to help keep track of the instructions about where and how to use these. Pharmacies typically print the medication instructions only on the boxes and not  directly on the medication tubes.   If your medication is too expensive, please contact our office at [phone] option 4 or send [place] a message through [place].   We are unable to tell what your co-pay for medications will be in advance as this is different depending on your insurance coverage. However, we may be able to find a substitute medication at lower cost or fill out paperwork to get insurance to cover a needed medication.   If  a prior authorization is required to get your medication covered by your insurance company, please allow [place] 1-2 business days to complete this process.  Drug prices often vary depending on where the prescription is filled and some pharmacies may offer cheaper prices.  The website www.goodrx.com contains coupons for medications through different pharmacies. The prices here do not account for what the cost may be with help from insurance (it may be cheaper with your insurance), but the website can give you the price if you did not use any insurance.  - You can print the associated coupon and take it with your prescription to the pharmacy.  - You may also stop by our office during regular business hours and pick up a GoodRx coupon card.  - If you need your prescription sent electronically to a different pharmacy, notify our office through [facility] or by phone at [phone] option 4.     Si Usted Necesita Algo Despus de Su Visita  Tambin puede enviarnos un mensaje a travs de Pharmacist, community. Por lo general respondemos a los mensajes de MyChart en el transcurso de 1 a 2 das hbiles.  Para renovar recetas, por favor pida a su farmacia que se ponga en contacto con nuestra oficina. Harland Dingwall de fax es [place] [phone].  Si tiene un asunto urgente cuando la clnica est cerrada y que no puede esperar hasta el siguiente da hbil, puede llamar/localizar a su doctor(a) al nmero que aparece a continuacin.   Por favor, tenga en cuenta que aunque hacemos todo lo posible para estar disponibles para asuntos urgentes fuera del horario de Protection, no estamos disponibles las 24 horas del da, los 7 das de la [place].   Si tiene un problema urgente y no puede comunicarse con nosotros, puede optar por buscar atencin mdica  en el consultorio de su doctor(a), en una clnica privada, en un centro de atencin urgente o en una sala de emergencias.  Si tiene Engineering geologist, por favor llame  inmediatamente al 911 o vaya a la sala de emergencias.  Nmeros de bper  - Dr. Nehemiah Massed: [phone]  - Dra. Moye: [phone]  - Dra. Nicole Kindred: [phone]  En caso de inclemencias del [place], por favor llame a Johnsie Kindred principal al [phone] para una actualizacin sobre el [place] de cualquier retraso o cierre.  Consejos para la medicacin en dermatologa: Por favor, guarde las cajas en las que vienen los medicamentos de uso tpico para ayudarle a seguir las instrucciones sobre dnde y cmo usarlos. Las farmacias generalmente imprimen las instrucciones del medicamento slo en las cajas y no directamente en los tubos del [place].   Si su medicamento es muy caro, por favor, pngase en contacto con Zigmund Daniel llamando al [phone] y presione la opcin 4 o envenos un mensaje a travs de Pharmacist, community.   No podemos decirle cul ser su copago por los medicamentos por adelantado ya que esto es diferente dependiendo de la cobertura de su seguro. Sin embargo, es posible que podamos encontrar un medicamento  sustituto a Electrical engineer un formulario para que el seguro cubra el medicamento que se considera necesario.   Si se requiere una autorizacin previa para que su compaa de seguros [place] su medicamento, por favor permtanos de 1 a 2 das hbiles para completar este proceso.  Los precios de los medicamentos varan con frecuencia dependiendo del Environmental consultant de dnde se surte la receta y alguna farmacias pueden ofrecer precios ms baratos.  El sitio web www.goodrx.com tiene cupones para medicamentos de Airline pilot. Los precios aqu no tienen en cuenta lo que podra costar con la ayuda del seguro (puede ser ms barato con su seguro), pero el sitio web puede darle el precio si no utiliz Research scientist (physical sciences).  - Puede imprimir el cupn correspondiente y llevarlo con su receta a la farmacia.  - Tambin puede pasar por nuestra oficina durante el horario de atencin regular y Charity fundraiser  una tarjeta de cupones de GoodRx.  - Si necesita que su receta se enve electrnicamente a una farmacia diferente, informe a nuestra oficina a travs de MyChart de [place] o por telfono llamando al [phone] y presione la opcin 4.

## 2022-10-22 ENCOUNTER — Inpatient Hospital Stay: Payer: Managed Care, Other (non HMO)

## 2022-10-25 ENCOUNTER — Encounter: Payer: Self-pay | Admitting: Dermatology

## 2022-11-13 ENCOUNTER — Other Ambulatory Visit: Payer: Self-pay | Admitting: Family Medicine

## 2022-11-13 DIAGNOSIS — D241 Benign neoplasm of right breast: Secondary | ICD-10-CM

## 2022-11-20 ENCOUNTER — Inpatient Hospital Stay: Payer: Managed Care, Other (non HMO) | Attending: Oncology

## 2022-12-24 ENCOUNTER — Inpatient Hospital Stay: Payer: Managed Care, Other (non HMO) | Attending: Oncology

## 2022-12-29 ENCOUNTER — Inpatient Hospital Stay: Payer: Managed Care, Other (non HMO) | Admitting: Oncology

## 2022-12-29 ENCOUNTER — Inpatient Hospital Stay: Payer: Managed Care, Other (non HMO)

## 2023-01-14 ENCOUNTER — Inpatient Hospital Stay: Payer: Managed Care, Other (non HMO) | Attending: Oncology

## 2023-01-14 ENCOUNTER — Other Ambulatory Visit: Payer: Managed Care, Other (non HMO)

## 2023-01-14 DIAGNOSIS — N920 Excessive and frequent menstruation with regular cycle: Secondary | ICD-10-CM | POA: Diagnosis not present

## 2023-01-14 DIAGNOSIS — D500 Iron deficiency anemia secondary to blood loss (chronic): Secondary | ICD-10-CM | POA: Diagnosis present

## 2023-01-14 DIAGNOSIS — E538 Deficiency of other specified B group vitamins: Secondary | ICD-10-CM | POA: Diagnosis not present

## 2023-01-14 LAB — IRON AND TIBC
Iron: 63 ug/dL (ref 28–170)
Saturation Ratios: 17 % (ref 10.4–31.8)
TIBC: 364 ug/dL (ref 250–450)
UIBC: 301 ug/dL

## 2023-01-14 LAB — CBC WITH DIFFERENTIAL/PLATELET
Abs Immature Granulocytes: 0.04 10*3/uL (ref 0.00–0.07)
Basophils Absolute: 0 10*3/uL (ref 0.0–0.1)
Basophils Relative: 0 %
Eosinophils Absolute: 0.1 10*3/uL (ref 0.0–0.5)
Eosinophils Relative: 2 %
HCT: 39.6 % (ref 36.0–46.0)
Hemoglobin: 12.7 g/dL (ref 12.0–15.0)
Immature Granulocytes: 1 %
Lymphocytes Relative: 30 %
Lymphs Abs: 1.2 10*3/uL (ref 0.7–4.0)
MCH: 29.3 pg (ref 26.0–34.0)
MCHC: 32.1 g/dL (ref 30.0–36.0)
MCV: 91.5 fL (ref 80.0–100.0)
Monocytes Absolute: 0.3 10*3/uL (ref 0.1–1.0)
Monocytes Relative: 7 %
Neutro Abs: 2.4 10*3/uL (ref 1.7–7.7)
Neutrophils Relative %: 60 %
Platelets: 216 10*3/uL (ref 150–400)
RBC: 4.33 MIL/uL (ref 3.87–5.11)
RDW: 12.2 % (ref 11.5–15.5)
WBC: 4 10*3/uL (ref 4.0–10.5)
nRBC: 0 % (ref 0.0–0.2)

## 2023-01-14 LAB — FERRITIN: Ferritin: 15 ng/mL (ref 11–307)

## 2023-01-15 LAB — VITAMIN B12: Vitamin B-12: 363 pg/mL (ref 180–914)

## 2023-01-18 ENCOUNTER — Inpatient Hospital Stay: Payer: Managed Care, Other (non HMO)

## 2023-01-18 ENCOUNTER — Inpatient Hospital Stay (HOSPITAL_BASED_OUTPATIENT_CLINIC_OR_DEPARTMENT_OTHER): Payer: Managed Care, Other (non HMO) | Admitting: Oncology

## 2023-01-18 ENCOUNTER — Encounter: Payer: Self-pay | Admitting: Oncology

## 2023-01-18 VITALS — BP 114/71 | HR 69 | Temp 96.9°F | Wt 130.6 lb

## 2023-01-18 VITALS — BP 106/69 | HR 60 | Resp 16

## 2023-01-18 DIAGNOSIS — D500 Iron deficiency anemia secondary to blood loss (chronic): Secondary | ICD-10-CM

## 2023-01-18 DIAGNOSIS — E538 Deficiency of other specified B group vitamins: Secondary | ICD-10-CM | POA: Diagnosis not present

## 2023-01-18 DIAGNOSIS — N920 Excessive and frequent menstruation with regular cycle: Secondary | ICD-10-CM | POA: Diagnosis not present

## 2023-01-18 MED ORDER — SODIUM CHLORIDE 0.9 % IV SOLN
INTRAVENOUS | Status: DC
  Filled 2023-01-18: qty 250

## 2023-01-18 MED ORDER — SODIUM CHLORIDE 0.9 % IV SOLN
200.0000 mg | Freq: Once | INTRAVENOUS | Status: AC
  Administered 2023-01-18: 200 mg via INTRAVENOUS
  Filled 2023-01-18: qty 200

## 2023-01-18 NOTE — Assessment & Plan Note (Signed)
Continue vitamin B-12 1000 mcg   monthly, managed by your primary

## 2023-01-18 NOTE — Progress Notes (Signed)
Pt has been educated and understands. Pt declined to stay 30 mins after iron infusion. VSS.  

## 2023-01-18 NOTE — Assessment & Plan Note (Addendum)
Labs are reviewed and discussed with patient. Lab Results  Component Value Date   IRON 63 01/14/2023   TIBC 364 01/14/2023   IRONPCTSAT 17 01/14/2023   FERRITIN 15 01/14/2023   I recommend one dose of Venofer for maintenance.  Etiology of iron deficiency anemia is likely due to heavy menses as well as ulcerative colitis.

## 2023-01-18 NOTE — Progress Notes (Signed)
Hematology/Oncology Progress note [phone] [phone]) [phone]     CHIEF COMPLAINTS/REASON FOR VISIT:  Follow-up for iron deficiency anemia.   ASSESSMENT & PLAN:   Iron deficiency anemia due to chronic blood loss Labs are reviewed and discussed with patient. Lab Results  Component Value Date   IRON 63 01/14/2023   TIBC 364 01/14/2023   IRONPCTSAT 17 01/14/2023   FERRITIN 15 01/14/2023   I recommend one dose of Venofer for maintenance.  Etiology of iron deficiency anemia is likely due to heavy menses as well as ulcerative colitis.  Vitamin B12 deficiency Continue vitamin B-12 1000 mcg   monthly, managed by your primary    Orders Placed This Encounter  Procedures   CBC with Differential (Cancer Center Only)    Standing Status:   Future    Standing Expiration Date:   01/18/2024   Iron and TIBC    Standing Status:   Future    Standing Expiration Date:   01/18/2024   Ferritin    Standing Status:   Future    Standing Expiration Date:   01/18/2024   Retic Panel    Standing Status:   Future    Standing Expiration Date:   01/18/2024   Follow-up in 6 months. All questions were answered. The patient knows to call the clinic with any problems, questions or concerns.  Rickard Patience, MD, PhD [facility] Hematology Oncology 01/18/2023     HISTORY OF PRESENTING ILLNESS:   Ivyonna Ramos is a  37 y.o.  female with PMH listed below was seen in consultation at the request of  Fields, Lisabeth Pick, NP  for evaluation of anemia.   Chronic anemia, 07/01/2021 cbc showed Hb of 9.6, mcv 80.4.  Her previous blood work results with her PCP were not available to me.  Patient reports that she can not tolerate oral iron supplementation.   History of ulcerarive colitis, patient reports that symptoms are controlled, no black or bloody stool.  Patient has lupus and follows up with Dr.Patel.  On Plaquenil.   She has heavy menstrual periods. She has seen Gyn and is not interested in  surgical options. She can not tolerate oral contraceptives due to developing skin rash.   Patient follows up with rheumatology for lupus.  ESR is normal.  Patient is on Plaquenil and Imuran  INTERVAL HISTORY Diana Ramos is a 37 y.o. female who has above history reviewed by me today presents for follow up visit for management of iron deficiency anemia. Patient has previously received IV Venofer treatments and tolerates well Her fatigue improved + Heavy menses  Review of Systems  Constitutional:  Positive for fatigue. Negative for appetite change, chills and fever.  HENT:   Negative for hearing loss and voice change.   Eyes:  Negative for eye problems.  Respiratory:  Negative for chest tightness and cough.   Cardiovascular:  Negative for chest pain.  Gastrointestinal:  Negative for abdominal distention, abdominal pain and blood in stool.  Endocrine: Negative for hot flashes.  Genitourinary:  Positive for menstrual problem. Negative for difficulty urinating and frequency.   Musculoskeletal:  Positive for arthralgias.  Skin:  Negative for itching and rash.  Neurological:  Negative for extremity weakness.  Hematological:  Negative for adenopathy.  Psychiatric/Behavioral:  Negative for confusion.     MEDICAL HISTORY:  Past Medical History:  Diagnosis Date   Anemia    Lupus (HCC)    Ulcerative colitis (HCC)     SURGICAL HISTORY: Past Surgical History:  Procedure Laterality Date   BREAST BIOPSY Left 05/30/2020   [place] bx, papilloma, vision marker   BREAST BIOPSY WITH RADIO FREQUENCY LOCALIZER Left 06/21/2020   Procedure: BREAST BIOPSY WITH RADIO FREQUENCY LOCALIZER;  Surgeon: Sung Amabile, DO;  Location: [facility] ORS;  Service: General;  Laterality: Left;   BREAST EXCISIONAL BIOPSY Left 06/21/2020   papilloma, negative for atypia and malignancy   BREAST LUMPECTOMY Left    10/ 2021   CESAREAN SECTION     x3   TONSILLECTOMY     TONSILLECTOMY AND ADENOIDECTOMY     TUBAL LIGATION  Bilateral     SOCIAL HISTORY: Social History   Socioeconomic History   Marital status: Married    Spouse name: Not on file   Number of children: Not on file   Years of education: Not on file   Highest education level: Not on file  Occupational History   Not on file  Tobacco Use   Smoking status: Former    Packs/day: 0.50    Years: 3.00    Additional pack years: 0.00    Total pack years: 1.50    Types: Cigarettes    Quit date: 2007    Years since quitting: 17.3   Smokeless tobacco: Never  Vaping Use   Vaping Use: Never used  Substance and Sexual Activity   Alcohol use: Not Currently   Drug use: Not Currently    Types: Marijuana   Sexual activity: Not on file  Other Topics Concern   Not on file  Social History Narrative   Not on file   Social Determinants of Health   Financial Resource Strain: Not on file  Food Insecurity: Not on file  Transportation Needs: Not on file  Physical Activity: Not on file  Stress: Not on file  Social Connections: Not on file  Intimate Partner Violence: Not on file    FAMILY HISTORY: Family History  Problem Relation Age of Onset   Hashimoto's thyroiditis Mother    Atrial fibrillation Father    Heart disease Father    Melanoma Brother    Atrial fibrillation Maternal Grandmother    Lung cancer Maternal Grandmother    Bladder Cancer Maternal Grandmother    Lung cancer Maternal Grandfather     ALLERGIES:  is allergic to doxaphene [propoxyphene], doxycycline, and ciprofloxacin.  MEDICATIONS:  Current Outpatient Medications  Medication Sig Dispense Refill   ALPRAZolam (XANAX) 0.5 MG tablet Take 0.5 mg by mouth daily as needed.     clindamycin (CLEOCIN T) 1 % external solution Apply topically every morning. For face 30 mL 11   diclofenac Sodium (VOLTAREN) 1 % GEL Apply 1 Application topically 4 (four) times daily.     hydroxychloroquine (PLAQUENIL) 200 MG tablet Take 200 mg by mouth daily.     hyoscyamine (LEVSIN SL) 0.125 MG SL  tablet Place 0.125 mg under the tongue every 4 (four) hours as needed (IBS).     Belimumab (BENLYSTA IV) Inject into the vein. Unsure of the dose.     cyanocobalamin (VITAMIN B12) 1000 MCG/ML injection Inject into the muscle. At the primary office.     cyclobenzaprine (FLEXERIL) 5 MG tablet Take 5 mg by mouth 3 (three) times daily as needed for muscle spasms (for jaw clenching). (Patient not taking: Reported on 01/18/2023)     fluticasone (FLONASE) 50 MCG/ACT nasal spray Place 1 spray into both nostrils daily. (Patient not taking: Reported on 01/18/2023)     hydrocortisone 2.5 % ointment Apply topically 2 (two) times  daily. To affected area for up to 1 week (Patient not taking: Reported on 06/24/2022) 20 g 0   levocetirizine (XYZAL) 5 MG tablet Take 1 tablet by mouth every evening. (Patient not taking: Reported on 01/18/2023)     montelukast (SINGULAIR) 10 MG tablet Take 10 mg by mouth daily. (Patient not taking: Reported on 01/18/2023)     mupirocin ointment (BACTROBAN) 2 % APPLY TO HEALING WOUNDS AND COVER WITH BANDAGE ONE TIME DAILY UNTIL HEALED (Patient not taking: Reported on 01/18/2023) 22 g 0   ondansetron (ZOFRAN-ODT) 4 MG disintegrating tablet Take 1 tablet (4 mg total) by mouth every 8 (eight) hours as needed for nausea or vomiting. (Patient not taking: Reported on 01/18/2023) 12 tablet 0   pantoprazole (PROTONIX) 40 MG tablet Take by mouth. (Patient not taking: Reported on 01/18/2023)     predniSONE (DELTASONE) 10 MG tablet Take 10 mg by mouth daily with breakfast. (Patient not taking: Reported on 01/18/2023)     Trifarotene (AKLIEF) 0.005 % CREA Apply pea sized amount nightly to face (Patient not taking: Reported on 01/18/2023) 45 g 11   venlafaxine XR (EFFEXOR-XR) 37.5 MG 24 hr capsule Take by mouth. (Patient not taking: Reported on 01/18/2023)     vitamin C (ASCORBIC ACID) 500 MG tablet Take 500 mg by mouth daily. (Patient not taking: Reported on 01/18/2023)     VITAMIN D PO Take 1 tablet by mouth  daily. (Patient not taking: Reported on 01/18/2023)     No current facility-administered medications for this visit.   Facility-Administered Medications Ordered in Other Visits  Medication Dose Route Frequency Provider Last Rate Last Admin   0.9 %  sodium chloride infusion   Intravenous Continuous Rickard Patience, MD   Stopped at 01/18/23 1537     PHYSICAL EXAMINATION:  Vitals:   01/18/23 1447  BP: 114/71  Pulse: 69  Temp: (!) 96.9 F (36.1 C)  SpO2: 100%   Filed Weights   01/18/23 1447  Weight: 130 lb 9.6 oz (59.2 kg)    Physical Exam Constitutional:      General: She is not in acute distress. HENT:     Head: Normocephalic and atraumatic.  Eyes:     General: No scleral icterus. Cardiovascular:     Rate and Rhythm: Normal rate and regular rhythm.     Heart sounds: Normal heart sounds.  Pulmonary:     Effort: Pulmonary effort is normal. No respiratory distress.     Breath sounds: No wheezing.  Abdominal:     General: Bowel sounds are normal. There is no distension.     Palpations: Abdomen is soft.  Musculoskeletal:        General: No deformity. Normal range of motion.     Cervical back: Normal range of motion and neck supple.  Skin:    General: Skin is warm and dry.     Coloration: Skin is not pale.  Neurological:     Mental Status: She is alert and oriented to person, place, and time. Mental status is at baseline.     Cranial Nerves: No cranial nerve deficit.  Psychiatric:        Mood and Affect: Mood normal.     LABORATORY DATA:  I have reviewed the data as listed    Latest Ref Rng & Units 01/14/2023    7:50 AM 06/22/2022    2:53 PM 04/09/2022    3:57 PM  CBC  WBC 4.0 - 10.5 K/uL 4.0  8.9  4.6  Hemoglobin 12.0 - 15.0 g/dL 16.1  09.6  04.5   Hematocrit 36.0 - 46.0 % 39.6  37.8  40.5   Platelets 150 - 400 K/uL 216  289  250       Latest Ref Rng & Units 04/09/2022    3:57 PM 07/21/2021    3:49 PM 01/02/2021    1:39 PM  CMP  Glucose 70 - 99 mg/dL 86  98  86    BUN 6 - 20 mg/dL 9  9  11    Creatinine 0.44 - 1.00 mg/dL 4.09  8.11  9.14   Sodium 135 - 145 mmol/L 139  139  141   Potassium 3.5 - 5.1 mmol/L 3.7  3.7  3.2   Chloride 98 - 111 mmol/L 105  102  105   CO2 22 - 32 mmol/L 24  29  26    Calcium 8.9 - 10.3 mg/dL 9.5  8.7  9.1   Total Protein 6.5 - 8.1 g/dL 8.4  8.4  7.8   Total Bilirubin 0.3 - 1.2 mg/dL 0.8  0.8  0.6   Alkaline Phos 38 - 126 U/L 43  49  40   AST 15 - 41 U/L 19  18  18    ALT 0 - 44 U/L 13  9  8      Iron/TIBC/Ferritin/ %Sat    Component Value Date/Time   IRON 63 01/14/2023 0750   TIBC 364 01/14/2023 0750   FERRITIN 15 01/14/2023 0750   IRONPCTSAT 17 01/14/2023 0750       RADIOGRAPHIC STUDIES: I have personally reviewed the radiological images as listed and agreed with the findings in the report. No results found.

## 2023-01-25 ENCOUNTER — Ambulatory Visit: Payer: Managed Care, Other (non HMO)

## 2023-02-02 ENCOUNTER — Ambulatory Visit
Admission: RE | Admit: 2023-02-02 | Discharge: 2023-02-02 | Disposition: A | Discharge status: Home / Self Care | Payer: Managed Care, Other (non HMO) | Source: Ambulatory Visit | Attending: Family Medicine | Admitting: Family Medicine

## 2023-02-02 DIAGNOSIS — D241 Benign neoplasm of right breast: Secondary | ICD-10-CM | POA: Diagnosis present

## 2023-02-17 ENCOUNTER — Ambulatory Visit: Payer: Managed Care, Other (non HMO)

## 2023-03-19 ENCOUNTER — Ambulatory Visit: Payer: Managed Care, Other (non HMO)

## 2023-04-02 ENCOUNTER — Other Ambulatory Visit: Payer: Self-pay

## 2023-04-02 ENCOUNTER — Encounter: Payer: Self-pay | Admitting: Oncology

## 2023-04-19 ENCOUNTER — Ambulatory Visit: Payer: Managed Care, Other (non HMO)

## 2023-04-20 ENCOUNTER — Other Ambulatory Visit: Payer: Self-pay | Admitting: Dermatology

## 2023-04-20 DIAGNOSIS — L700 Acne vulgaris: Secondary | ICD-10-CM

## 2023-04-28 ENCOUNTER — Encounter: Payer: Managed Care, Other (non HMO) | Admitting: Dermatology

## 2023-05-04 ENCOUNTER — Encounter: Payer: Self-pay | Admitting: Oncology

## 2023-05-04 ENCOUNTER — Other Ambulatory Visit: Payer: Self-pay

## 2023-05-04 MED ORDER — PREDNISONE 10 MG PO TABS
ORAL_TABLET | ORAL | 0 refills | Status: DC
  Filled 2023-05-04 – 2023-05-05 (×2): qty 30, 12d supply, fill #0

## 2023-05-05 ENCOUNTER — Other Ambulatory Visit: Payer: Self-pay

## 2023-05-10 ENCOUNTER — Encounter: Payer: Managed Care, Other (non HMO) | Admitting: Dermatology

## 2023-05-20 ENCOUNTER — Ambulatory Visit: Payer: Managed Care, Other (non HMO)

## 2023-05-27 ENCOUNTER — Encounter: Payer: Self-pay | Admitting: Dermatology

## 2023-05-27 DIAGNOSIS — L700 Acne vulgaris: Secondary | ICD-10-CM

## 2023-05-28 ENCOUNTER — Other Ambulatory Visit: Payer: Self-pay

## 2023-05-28 MED ORDER — VITAMIN D (ERGOCALCIFEROL) 1.25 MG (50000 UNIT) PO CAPS
50000.0000 [IU] | ORAL_CAPSULE | ORAL | 0 refills | Status: DC
  Filled 2023-05-28: qty 12, 84d supply, fill #0

## 2023-05-31 MED ORDER — CLINDAMYCIN PHOSPHATE 1 % EX SOLN
CUTANEOUS | 0 refills | Status: DC
Start: 2023-05-31 — End: 2023-06-07

## 2023-06-02 ENCOUNTER — Other Ambulatory Visit: Payer: Self-pay

## 2023-06-02 MED ORDER — NYSTATIN 100000 UNIT/ML MT SUSP
5.0000 mL | Freq: Four times a day (QID) | OROMUCOSAL | 0 refills | Status: DC
  Filled 2023-06-02: qty 200, 10d supply, fill #0

## 2023-06-07 ENCOUNTER — Other Ambulatory Visit: Payer: Self-pay

## 2023-06-07 ENCOUNTER — Ambulatory Visit (INDEPENDENT_AMBULATORY_CARE_PROVIDER_SITE_OTHER): Payer: Managed Care, Other (non HMO) | Admitting: Dermatology

## 2023-06-07 ENCOUNTER — Encounter: Payer: Self-pay | Admitting: Dermatology

## 2023-06-07 VITALS — BP 99/63 | HR 85

## 2023-06-07 DIAGNOSIS — Z7189 Other specified counseling: Secondary | ICD-10-CM

## 2023-06-07 DIAGNOSIS — L700 Acne vulgaris: Secondary | ICD-10-CM

## 2023-06-07 DIAGNOSIS — D1801 Hemangioma of skin and subcutaneous tissue: Secondary | ICD-10-CM

## 2023-06-07 DIAGNOSIS — I781 Nevus, non-neoplastic: Secondary | ICD-10-CM

## 2023-06-07 DIAGNOSIS — D229 Melanocytic nevi, unspecified: Secondary | ICD-10-CM

## 2023-06-07 DIAGNOSIS — L821 Other seborrheic keratosis: Secondary | ICD-10-CM

## 2023-06-07 DIAGNOSIS — L578 Other skin changes due to chronic exposure to nonionizing radiation: Secondary | ICD-10-CM

## 2023-06-07 DIAGNOSIS — Z1283 Encounter for screening for malignant neoplasm of skin: Secondary | ICD-10-CM

## 2023-06-07 DIAGNOSIS — L814 Other melanin hyperpigmentation: Secondary | ICD-10-CM | POA: Diagnosis not present

## 2023-06-07 DIAGNOSIS — W908XXA Exposure to other nonionizing radiation, initial encounter: Secondary | ICD-10-CM

## 2023-06-07 DIAGNOSIS — R21 Rash and other nonspecific skin eruption: Secondary | ICD-10-CM

## 2023-06-07 DIAGNOSIS — Z808 Family history of malignant neoplasm of other organs or systems: Secondary | ICD-10-CM

## 2023-06-07 DIAGNOSIS — B360 Pityriasis versicolor: Secondary | ICD-10-CM

## 2023-06-07 MED ORDER — ADAPALENE 0.3 % EX GEL
1.0000 | Freq: Every day | CUTANEOUS | 5 refills | Status: DC
  Filled 2023-06-07: qty 45, 45d supply, fill #0
  Filled 2024-02-10: qty 45, 34d supply, fill #1

## 2023-06-07 MED ORDER — CLINDAMYCIN PHOSPHATE 1 % EX SWAB
1.0000 | Freq: Every day | CUTANEOUS | 5 refills | Status: DC | PRN
  Filled 2023-06-07: qty 60, 60d supply, fill #0
  Filled 2023-08-24: qty 60, 30d supply, fill #1
  Filled 2023-10-31: qty 60, 30d supply, fill #2
  Filled 2024-02-10: qty 60, 30d supply, fill #3
  Filled 2024-05-22: qty 60, 30d supply, fill #4

## 2023-06-07 MED ORDER — KETOCONAZOLE 2 % EX SHAM
MEDICATED_SHAMPOO | CUTANEOUS | 5 refills | Status: DC
  Filled 2023-06-07: qty 120, 60d supply, fill #0

## 2023-06-07 MED ORDER — CETIRIZINE HCL 10 MG PO TABS
10.0000 mg | ORAL_TABLET | Freq: Every day | ORAL | 1 refills | Status: AC
  Filled 2023-06-07: qty 90, 90d supply, fill #0
  Filled 2023-09-05: qty 90, 90d supply, fill #1

## 2023-06-07 NOTE — Patient Instructions (Signed)
Due to recent changes in healthcare laws, you may see results of your pathology and/or laboratory studies on MyChart before the doctors have had a chance to review them. We understand that in some cases there may be results that are confusing or concerning to you. Please understand that not all results are received at the same time and often the doctors may need to interpret multiple results in order to provide you with the best plan of care or course of treatment. Therefore, we ask that you please give [place] 2 business days to thoroughly review all your results before contacting the office for clarification. Should we see a critical lab result, you will be contacted sooner.   If You Need Anything After Your Visit  If you have any questions or concerns for your doctor, please call our main line at [phone] and press option 4 to reach your doctor's medical assistant. If no one answers, please leave a voicemail as directed and we will return your call as soon as possible. Messages left after 4 pm will be answered the following business day.   You may also send [place] a message via MyChart. We typically respond to MyChart messages within 1-2 business days.  For prescription refills, please ask your pharmacy to contact our office. Our fax number is [phone].  If you have an urgent issue when the clinic is closed that cannot wait until the next business day, you can page your doctor at the number below.    Please note that while we do our best to be available for urgent issues outside of office hours, we are not available 24/7.   If you have an urgent issue and are unable to reach [place], you may choose to seek medical care at your doctor's office, retail clinic, urgent care center, or emergency room.  If you have a medical emergency, please immediately call 911 or go to the emergency department.  Pager Numbers  - Dr. Gwen Pounds: [phone]  - Dr. Roseanne Reno: [phone]  - Dr. Katrinka Blazing: [phone]    In the event of inclement weather, please call our main line at [phone] for an update on the status of any delays or closures.  Dermatology Medication Tips: Please keep the boxes that topical medications come in in order to help keep track of the instructions about where and how to use these. Pharmacies typically print the medication instructions only on the boxes and not directly on the medication tubes.   If your medication is too expensive, please contact our office at [phone] option 4 or send [place] a message through MyChart.   We are unable to tell what your co-pay for medications will be in advance as this is different depending on your insurance coverage. However, we may be able to find a substitute medication at lower cost or fill out paperwork to get insurance to cover a needed medication.   If a prior authorization is required to get your medication covered by your insurance company, please allow [place] 1-2 business days to complete this process.  Drug prices often vary depending on where the prescription is filled and some pharmacies may offer cheaper prices.  The website www.goodrx.com contains coupons for medications through different pharmacies. The prices here do not account for what the cost may be with help from insurance (it may be cheaper with your insurance), but the website can give you the price if you did not use any insurance.  - You can print the associated coupon and take it  with your prescription to the pharmacy.  - You may also stop by our office during regular business hours and pick up a GoodRx coupon card.  - If you need your prescription sent electronically to a different pharmacy, notify our office through [facility] or by phone at [phone] option 4.     Si Usted Necesita Algo Despus de Su Visita  Tambin puede enviarnos un mensaje a travs de Clinical cytogeneticist. Por lo general respondemos a los mensajes de MyChart en el transcurso de 1 a 2 das  hbiles.  Para renovar recetas, por favor pida a su farmacia que se ponga en contacto con nuestra oficina. Annie Sable de fax es [place] [phone].  Si tiene un asunto urgente cuando la clnica est cerrada y que no puede esperar hasta el siguiente da hbil, puede llamar/localizar a su doctor(a) al nmero que aparece a continuacin.   Por favor, tenga en cuenta que aunque hacemos todo lo posible para estar disponibles para asuntos urgentes fuera del horario de [place], no estamos disponibles las 24 horas del da, los 7 [address]  [address] de la [place].   Si tiene un problema urgente y no puede comunicarse con nosotros, puede optar por buscar atencin mdica  en el consultorio de su doctor(a), en una clnica privada, en un centro de atencin urgente o en una sala de emergencias.  Si tiene Engineer, drilling, por favor llame inmediatamente al 911 o vaya a la sala de emergencias.  Nmeros de bper  - Dr. Gwen Pounds: [phone]  - Dra. Roseanne Reno: [phone]  - Dr. Katrinka Blazing: [phone]   En caso de inclemencias del tiempo, por favor llame a Lacy Duverney principal al [phone] para una actualizacin sobre el [place] de cualquier retraso o cierre.  Consejos para la medicacin en dermatologa: Por favor, guarde las cajas en las que vienen los medicamentos de uso tpico para ayudarle a seguir las instrucciones sobre dnde y cmo usarlos. Las farmacias generalmente imprimen las instrucciones del medicamento slo en las cajas y no directamente en los tubos del [place].   Si su medicamento es muy caro, por favor, pngase en contacto con Rolm Gala llamando al [phone] y presione la opcin 4 o envenos un mensaje a travs de Clinical cytogeneticist.   No podemos decirle cul ser su copago por los medicamentos por adelantado ya que esto es diferente dependiendo de la cobertura de su seguro. Sin embargo, es posible que podamos encontrar un medicamento sustituto a Audiological scientist un formulario para que el  seguro cubra el medicamento que se considera necesario.   Si se requiere una autorizacin previa para que su compaa de seguros [place] su medicamento, por favor permtanos de 1 a 2 das hbiles para completar [address]  Los precios de los medicamentos varan con frecuencia dependiendo del Environmental consultant de dnde se surte la receta y alguna farmacias pueden ofrecer precios ms baratos.  El sitio web www.goodrx.com tiene cupones para medicamentos de Health and safety inspector. Los precios aqu no tienen en cuenta lo que podra costar con la ayuda del seguro (puede ser ms barato con su seguro), pero el sitio web puede darle el precio si no utiliz Tourist information centre manager.  - Puede imprimir el cupn correspondiente y llevarlo con su receta a la farmacia.  - Tambin puede pasar por nuestra oficina durante el horario de atencin regular y Education officer, museum una tarjeta de cupones de GoodRx.  - Si necesita que su receta se enve electrnicamente a Psychiatrist, informe a nuestra oficina a travs de MyChart de  [place] o por telfono llamando al [phone] y presione la opcin 4.

## 2023-06-07 NOTE — Addendum Note (Signed)
Addended by: Elie Goody on: 06/07/2023 09:32 AM   Modules accepted: Level of Service

## 2023-06-07 NOTE — Progress Notes (Signed)
Follow-Up Visit   Subjective  Diana Ramos is a 37 y.o. female who presents for the following: Skin Cancer Screening and Full Body Skin Exam  Acne - currently using Differin 0.1%, but would like to increase the strength, and Clindamycin solution, but she would like to see if it still comes in pads.  Hx of lupus, patient currently using Plaquenil 200 mg po QD and IV Benlysta.   Rash on the back - patient concerned she may have a fungal infection.  The patient presents for Total-Body Skin Exam (TBSE) for skin cancer screening and mole check. The patient has spots, moles and lesions to be evaluated, some may be new or changing and the patient may have concern these could be cancer.  The following portions of the chart were reviewed this encounter and updated as appropriate: medications, allergies, medical history  Review of Systems:  No other skin or systemic complaints except as noted in HPI or Assessment and Plan.  Objective  Well appearing patient in no apparent distress; mood and affect are within normal limits.  A full examination was performed including scalp, head, eyes, ears, nose, lips, neck, chest, axillae, abdomen, back, buttocks, bilateral upper extremities, bilateral lower extremities, hands, feet, fingers, toes, fingernails, and toenails. All findings within normal limits unless otherwise noted below.   Relevant physical exam findings are noted in the Assessment and Plan.   Assessment & Plan   SKIN CANCER SCREENING PERFORMED TODAY.  ACTINIC DAMAGE - Chronic condition, secondary to cumulative UV/sun exposure - diffuse scaly erythematous macules with underlying dyspigmentation - Recommend daily broad spectrum sunscreen SPF 30+ to sun-exposed areas, reapply every 2 hours as needed.  - Staying in the shade or wearing long sleeves, sun glasses (UVA+UVB protection) and wide brim hats (4-inch brim around the entire circumference of the hat) are also recommended for sun  protection.  - Call for new or changing lesions.  LENTIGINES, SEBORRHEIC KERATOSES, HEMANGIOMAS - Benign normal skin lesions - Benign-appearing - Call for any changes  MELANOCYTIC NEVI - Tan-brown and/or pink-flesh-colored symmetric macules and papules - Benign appearing on exam today - Observation - Call clinic for new or changing moles - Recommend daily use of broad spectrum spf 30+ sunscreen to sun-exposed areas.   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 Few inflamed papules on upper trunk  Chronic, mildly flaring, not at patient goal  Treatment Plan: Increase Differin to 0.3% gel QHS, and continue Clindamycin, will switch to wipes per patient request.   Hx of lupus (SLE) -  Continue care with rheumatologist and medications as prescribed (Plaquenil 200mg  po QD, and Benlysta IV).   Rash Exam: Four hyperpigmented circular patches on central back ineducable scale. (KOH + for numerous hyphal and yeast forms)  Differential diagnosis:  Tinea versicolor - Tinea versicolor is a chronic recurrent skin rash causing discolored scaly spots most commonly seen on back, chest, and/or shoulders.  It is generally asymptomatic. The rash is due to overgrowth of a common type of yeast present on everyone's skin and it is not contagious.  It tends to flare more in the summer due to increased sweating on trunk.  After rash is treated, the scaliness will resolve, but the discoloration will take longer to return to normal pigmentation. The periodic use of an OTC medicated soap/shampoo with zinc or selenium sulfide can be helpful to prevent yeast overgrowth and recurrence.  Treatment Plan: Start Ketoconazole 2% shampoo 3d/wk use as a body wash in the  shower.  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 Call for changes.  Return in about 1 year (around 06/06/2024) for TBSE.  Maylene Roes, CMA, am acting as scribe for Elie Goody, MD .  Documentation: I have reviewed the above documentation for accuracy and completeness, and I agree with the above.  Elie Goody, MD

## 2023-06-10 ENCOUNTER — Other Ambulatory Visit: Payer: Self-pay | Admitting: Obstetrics and Gynecology

## 2023-06-13 ENCOUNTER — Other Ambulatory Visit: Payer: Self-pay

## 2023-06-13 MED ORDER — ALPRAZOLAM 0.25 MG PO TABS
0.2500 mg | ORAL_TABLET | Freq: Two times a day (BID) | ORAL | 0 refills | Status: DC | PRN
  Filled 2023-06-13: qty 45, 23d supply, fill #0

## 2023-06-21 ENCOUNTER — Ambulatory Visit: Payer: Managed Care, Other (non HMO)

## 2023-06-28 ENCOUNTER — Encounter: Payer: Self-pay | Admitting: Oncology

## 2023-06-29 NOTE — H&P (Signed)
Preoperative History and Physical  Chief Complaint: Diana Ramos is a 37 y.o. [phone] here for surgical management of Menorrhagia  with irregular cycle.   No significant preoperative concerns.  History of Present Illness: 37 y.o. [phone] female who presents in follow up from an ER visit.  She was having flu-like symptoms last week and she passed out in the office. She went to the ER and a right ovarian cyst was found.  She has been having pain in her abdomen, back, and legs.  She can't wear tight-fitting pants due to pain. She has terrible (heavy) periods. She is anemic and gets iron infusions.  She has always had anemia, but never knew it might be from her periods.  She also has SLE.  She frequently gets sore to touch in her lower abdomen (leaning over to touch the counter) and bloating.  Her periods are very painful, sometimes she can't leave the house. She can't leave the house due to bleeding through her pads.  Her periods are usually regular, but the last two months have been a little off. Two months ago her period came a week earlier. Last month her period came a week earlier than the previous (after 2 weeks).     She has had conversations with Margaretmary Eddy, CNM, about controlling her heavy periods. She has had discussions about ablation and possibly hysterectomy (she's not sure she wants to go that far yet). She has tried some birth control to slow down. She got dark spots on her skin.  She stopped the medication.     She has a history of possible colitis. She is seeing GI for this. This is still being worked up.  The GI is not sure she has the correct diagnosis. She has gone to the ER for this a few times, too.     Leading up to her period (up to a week) she has a darker color discharge, then has her period, then for a week after her period she continues to have dark discharge for nearly a week.   Ultrasound on 12/28/2022:  Ultrasound demonstrates the following findings Adnexa: resolution  of right ovarian cyst, small right paraovarian cyst noted. Left ovarian cystic lesion present now that was not present on prior imaging. Uterus: retroverted with endometrial stripe  10.0 mm Additional: isthmocele measuring 1.5 x 0.9 x 0.8 cm.  Small fluid in cervix.    Since her last visit she has had similar symptoms. She was asked to do a stool test with GI to rule out Chron disease.  Her LMP was 12/08/22.  She is still getting iron infusions.   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  Normal beta-2 glycoprotein IgG.  Elevation anticardiolipin IgM 97, normal anticardiolipin IgG.  Normal lupus anticoagulant.   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   Past Surgical History:  Procedure Laterality Date   BREAST EXCISIONAL BIOPSY Left 06/2020   for papilloma by isami sakai   MASTECTOMY PARTIAL / LUMPECTOMY Left 06/21/2020   CESAREAN SECTION     TONSILLECTOMY     unlisted procedure  dentoalveolar structures     OB History  Gravida Para Term Preterm AB Living  6       3 3   SAB IAB Ectopic Molar Multiple Live Births  2 1            # Outcome Date GA Lbr Len/2nd Weight Sex Type Anes PTL Lv  6 Gravida           5 Gravida           4 Gravida           3 IAB           2 SAB           1 SAB           Patient denies any other pertinent gynecologic issues.   Current Outpatient Medications on File Prior to Visit  Medication Sig Dispense Refill   adapalene (DIFFERIN) 0.3 % topical gel      ALPRAZolam (XANAX) 0.25 MG tablet Take 1 tablet (0.25 mg total)  by mouth 2 (two) times daily as needed for Sleep for up to 30 days 45 tablet 0   cetirizine (ZYRTEC) 10 MG tablet Take 1 tablet (10 mg total) by mouth once daily 90 tablet 1   clindamycin (CLEOCIN T) 1 % swab      gabapentin (NEURONTIN) 100 MG capsule Take 1 capsule nightly, may increase to 2 after 3-4 days. 60 capsule 0   hydroxychloroquine (PLAQUENIL) 200 mg tablet Take 1 tablet (200 mg total) by mouth once daily 90 tablet 1   hyoscyamine (LEVSIN) 0.125 mg tablet Take 0.125 mg by mouth every 4 (four) hours as needed for Cramping     ketoconazole (NIZORAL) 2 % cream Apply topically once daily 60 g 0   ketoconazole (NIZORAL) 2 % shampoo      sertraline (ZOLOFT) 25 MG tablet Take 2 tablets (50 mg total) by mouth once daily 180 tablet 1   triamcinolone (NASACORT AQ) 55 mcg nasal spray Place 2 sprays into both nostrils once daily 10.8 mL 0   triamcinolone 0.5 % ointment Apply topically 2 (two) times daily 30 g 0   ergocalciferol, vitamin D2, (VITAMIN D2 ORAL) Take by mouth Once weekly     ergocalciferol, vitamin D2, 1,250 mcg (50,000 unit) capsule Take 1 capsule (50,000 Units total) by mouth once a week for 30 days 12 capsule 0   No current facility-administered medications on file prior to visit.   Allergies  Allergen Reactions   Doxycycline Other (See Comments)    Lupus Exacerbation   Latex Rash   Ciprofloxacin Rash    Social History:   reports that she has quit smoking. She has never used smokeless tobacco. She reports that she does not currently use alcohol. She reports current drug use. Drug: Marijuana.  Family History  Problem Relation Name Age of Onset   Hypothyroidism Mother     Atrial fibrillation (Abnormal heart rhythm sometimes requiring treatment with blood thinners) Father     Melanoma Brother     COPD Maternal Grandmother     Graves' disease Maternal Grandmother     Arthritis Maternal Grandmother     Lung cancer Maternal Grandfather     Heart disease Paternal Grandmother      Atrial fibrillation (Abnormal heart rhythm sometimes requiring treatment with blood thinners) Paternal Grandmother     Bladder Cancer Paternal Grandmother     Lung cancer Paternal Grandmother     Heart disease Paternal Grandfather  Atrial fibrillation (Abnormal heart rhythm sometimes requiring treatment with blood thinners) Paternal Grandfather      Review of Systems: Noncontributory  PHYSICAL EXAM: Blood pressure 109/71, pulse 65, height 152.4 cm (5'), weight 61.7 kg (136 lb), last menstrual period 06/24/2023. CONSTITUTIONAL: Well-developed, well-nourished female in no acute distress.  HENT:  Normocephalic, atraumatic, External right and left ear normal. Oropharynx is clear and moist EYES: Conjunctivae and EOM are normal. Pupils are equal, round, and reactive to light. No scleral icterus.  NECK: Normal range of motion, supple, no masses SKIN: Skin is warm and dry. No rash noted. Not diaphoretic. No erythema. No pallor. NEUROLGIC: Alert and oriented to person, place, and time. Normal reflexes, muscle tone coordination. No cranial nerve deficit noted. PSYCHIATRIC: Normal mood and affect. Normal behavior. Normal judgment and thought content. CARDIOVASCULAR: Normal heart rate noted, regular rhythm RESPIRATORY: Effort and breath sounds normal, no problems with respiration noted ABDOMEN: Soft, nontender, nondistended. PELVIC: Deferred MUSCULOSKELETAL: Normal range of motion. No edema and no tenderness. 2+ distal pulses.  Labs: No results found for this or any previous visit (from the past 336 hour(s)).  Imaging Studies: No results found.  Assessment: Patient Active Problem List  Diagnosis   Menorrhagia with irregular cycle    Plan: Patient will undergo surgical management with the above noted surgery.   The risks of surgery were discussed in detail with the patient including but not limited to: bleeding which may require transfusion or reoperation; infection which may require  antibiotics; injury to surrounding organs which may involve bowel, bladder, ureters ; need for additional procedures including laparoscopy or laparotomy; thromboembolic phenomenon, surgical site problems and other postoperative/anesthesia complications. Likelihood of success in alleviating the patient's condition was discussed. Routine postoperative instructions will be reviewed with the patient and her family in detail after surgery.  The patient concurred with the proposed plan, giving informed written consent for the surgery.   Preoperative prophylactic antibiotics, as indicated, and SCDs ordered on call to the OR.    Return in 1 month (on 08/02/2023) for post-op visit.   Attestation Statement:   I personally performed the service. (TP)  Thanh Mottern Teola Bradley, MD  [facility] 06/29/2023 9:45 AM

## 2023-07-07 ENCOUNTER — Encounter
Admission: RE | Admit: 2023-07-07 | Discharge: 2023-07-07 | Disposition: A | Discharge status: Home / Self Care | Payer: Managed Care, Other (non HMO) | Source: Ambulatory Visit | Attending: Obstetrics and Gynecology | Admitting: Obstetrics and Gynecology

## 2023-07-07 ENCOUNTER — Other Ambulatory Visit: Payer: Self-pay

## 2023-07-07 VITALS — Ht 60.0 in | Wt 136.0 lb

## 2023-07-07 DIAGNOSIS — Z01812 Encounter for preprocedural laboratory examination: Secondary | ICD-10-CM

## 2023-07-07 DIAGNOSIS — D500 Iron deficiency anemia secondary to blood loss (chronic): Secondary | ICD-10-CM

## 2023-07-07 HISTORY — DX: Vitamin D deficiency, unspecified: E55.9

## 2023-07-07 HISTORY — DX: Headache, unspecified: R51.9

## 2023-07-07 HISTORY — DX: Excessive and frequent menstruation with irregular cycle: N92.1

## 2023-07-07 HISTORY — DX: Deficiency of other specified B group vitamins: E53.8

## 2023-07-07 HISTORY — DX: Nausea with vomiting, unspecified: R11.2

## 2023-07-07 HISTORY — DX: Anxiety disorder, unspecified: F41.9

## 2023-07-07 HISTORY — DX: Other specified postprocedural states: Z98.890

## 2023-07-07 NOTE — Patient Instructions (Addendum)
Your procedure is scheduled on: 07/15/23 - Thursday Report to the Registration Desk on the 1st floor of the [facility]. To find out your arrival time, please call [phone] between 1PM - 3PM on: 07/14/23 - Wednesday If your arrival time is 6:00 am, do not arrive before that time as the [facility] entrance doors do not open until 6:00 am.  REMEMBER: Instructions that are not followed completely may result in serious medical risk, up to and including death; or upon the discretion of your surgeon and anesthesiologist your surgery may need to be rescheduled.  Do not eat food after midnight the night before surgery.  No gum chewing or hard candies.  You may however, drink CLEAR liquids up to 2 hours before you are scheduled to arrive for your surgery. Do not drink anything within 2 hours of your scheduled arrival time.  Clear liquids include: - water  - apple juice without pulp - gatorade (not RED colors) - black coffee or tea (Do NOT add milk or creamers to the coffee or tea) Do NOT drink anything that is not on this list.  In addition, your doctor has ordered for you to drink the provided:  Ensure Pre-Surgery Clear Carbohydrate Drink  Drinking this carbohydrate drink up to two hours before surgery helps to reduce insulin resistance and improve patient outcomes. Please complete drinking 2 hours before scheduled arrival time.  One week prior to surgery: Stop Anti-inflammatories (NSAIDS) such as Advil, Aleve, Ibuprofen, Motrin, Naproxen, Naprosyn and Aspirin based products such as Excedrin, Goody's Powder, BC Powder. You may take Tylenol if needed for pain up until the day of surgery.  Stop ANY OVER THE COUNTER supplements until after surgery.   ON THE DAY OF SURGERY ONLY TAKE THESE MEDICATIONS WITH SIPS OF WATER:  ALPRAZolam (XANAX) if needed   No Alcohol for 24 hours before or after surgery.  No Smoking including e-cigarettes for 24 hours before surgery.  No chewable  tobacco products for at least 6 hours before surgery.  No nicotine patches on the day of surgery.  Do not use any "recreational" drugs for at least a week (preferably 2 weeks) before your surgery.  Please be advised that the combination of cocaine and anesthesia may have negative outcomes, up to and including death. If you test positive for cocaine, your surgery will be cancelled.  On the morning of surgery brush your teeth with toothpaste and water, you may rinse your mouth with mouthwash if you wish. Do not swallow any toothpaste or mouthwash.  Do not wear jewelry, make-up, hairpins, clips or nail polish.  For welded (permanent) jewelry: bracelets, anklets, waist bands, etc.  Please have this removed prior to surgery.  If it is not removed, there is a chance that hospital personnel will need to cut it off on the day of surgery.  Do not wear lotions, powders, or perfumes.   Do not shave body hair from the neck down 48 hours before surgery.  Contact lenses, hearing aids and dentures may not be worn into surgery.  Do not bring valuables to the hospital. [facility] is not responsible for any missing/lost belongings or valuables.   Notify your doctor if there is any change in your medical condition (cold, fever, infection).  Wear comfortable clothing (specific to your surgery type) to the hospital.  After surgery, you can help prevent lung complications by doing breathing exercises.  Take deep breaths and cough every 1-2 hours. Your doctor may order a device called  an Facilities manager to help you take deep breaths. When coughing or sneezing, hold a pillow firmly against your incision with both hands. This is called "splinting." Doing this helps protect your incision. It also decreases belly discomfort.  If you are being admitted to the hospital overnight, leave your suitcase in the car. After surgery it may be brought to your room.  In case of increased patient census, it may be  necessary for you, the patient, to continue your postoperative care in the Same Day Surgery department.  If you are being discharged the day of surgery, you will not be allowed to drive home. You will need a responsible individual to drive you home and stay with you for 24 hours after surgery.   If you are taking public transportation, you will need to have a responsible individual with you.  Please call the Pre-admissions Testing Dept. at [phone] if you have any questions about these instructions.  Surgery Visitation Policy:  Patients having surgery or a procedure may have two visitors.  Children under the age of 71 must have an adult with them who is not the patient.  Inpatient Visitation:    Visiting hours are 7 a.m. to 8 p.m. Up to four visitors are allowed at one time in a patient room. The visitors may rotate out with other people during the day.  One visitor age 28 or older may stay with the patient overnight and must be in the room by 8 p.m.

## 2023-07-12 ENCOUNTER — Encounter
Admission: RE | Admit: 2023-07-12 | Discharge: 2023-07-12 | Disposition: A | Discharge status: Home / Self Care | Payer: Managed Care, Other (non HMO) | Source: Ambulatory Visit | Attending: Obstetrics and Gynecology | Admitting: Obstetrics and Gynecology

## 2023-07-12 DIAGNOSIS — D500 Iron deficiency anemia secondary to blood loss (chronic): Secondary | ICD-10-CM | POA: Diagnosis not present

## 2023-07-12 DIAGNOSIS — Z01812 Encounter for preprocedural laboratory examination: Secondary | ICD-10-CM | POA: Insufficient documentation

## 2023-07-12 LAB — CBC
HCT: 36.1 % (ref 36.0–46.0)
Hemoglobin: 11.4 g/dL — ABNORMAL LOW (ref 12.0–15.0)
MCH: 28.9 pg (ref 26.0–34.0)
MCHC: 31.6 g/dL (ref 30.0–36.0)
MCV: 91.4 fL (ref 80.0–100.0)
Platelets: 256 10*3/uL (ref 150–400)
RBC: 3.95 MIL/uL (ref 3.87–5.11)
RDW: 12.8 % (ref 11.5–15.5)
WBC: 4.8 10*3/uL (ref 4.0–10.5)
nRBC: 0 % (ref 0.0–0.2)

## 2023-07-15 ENCOUNTER — Other Ambulatory Visit: Payer: Self-pay

## 2023-07-15 ENCOUNTER — Ambulatory Visit
Admission: RE | Admit: 2023-07-15 | Discharge: 2023-07-15 | Disposition: A | Discharge status: Home / Self Care | Payer: Managed Care, Other (non HMO) | Attending: Obstetrics and Gynecology | Admitting: Obstetrics and Gynecology

## 2023-07-15 ENCOUNTER — Encounter: Payer: Self-pay | Admitting: Obstetrics and Gynecology

## 2023-07-15 ENCOUNTER — Ambulatory Visit: Payer: Managed Care, Other (non HMO) | Admitting: Urgent Care

## 2023-07-15 ENCOUNTER — Encounter
Admission: RE | Disposition: A | Discharge status: Home / Self Care | Payer: Self-pay | Source: Home / Self Care | Attending: Obstetrics and Gynecology

## 2023-07-15 ENCOUNTER — Ambulatory Visit: Payer: Managed Care, Other (non HMO) | Admitting: Anesthesiology

## 2023-07-15 DIAGNOSIS — Z8759 Personal history of other complications of pregnancy, childbirth and the puerperium: Secondary | ICD-10-CM | POA: Insufficient documentation

## 2023-07-15 DIAGNOSIS — N921 Excessive and frequent menstruation with irregular cycle: Secondary | ICD-10-CM | POA: Diagnosis present

## 2023-07-15 DIAGNOSIS — Z87891 Personal history of nicotine dependence: Secondary | ICD-10-CM | POA: Diagnosis not present

## 2023-07-15 DIAGNOSIS — Z01812 Encounter for preprocedural laboratory examination: Secondary | ICD-10-CM

## 2023-07-15 DIAGNOSIS — Z8711 Personal history of peptic ulcer disease: Secondary | ICD-10-CM | POA: Diagnosis not present

## 2023-07-15 DIAGNOSIS — D649 Anemia, unspecified: Secondary | ICD-10-CM | POA: Insufficient documentation

## 2023-07-15 HISTORY — PX: DILITATION & CURRETTAGE/HYSTROSCOPY WITH NOVASURE ABLATION: SHX5568

## 2023-07-15 LAB — GLUCOSE, CAPILLARY
Glucose-Capillary: 62 mg/dL — ABNORMAL LOW (ref 70–99)
Glucose-Capillary: 116 mg/dL — ABNORMAL HIGH (ref 70–99)
Glucose-Capillary: 93 mg/dL (ref 70–99)

## 2023-07-15 LAB — POCT PREGNANCY, URINE: Preg Test, Ur: NEGATIVE

## 2023-07-15 SURGERY — DILATATION & CURETTAGE/HYSTEROSCOPY WITH NOVASURE ABLATION
Anesthesia: General | Site: Vagina

## 2023-07-15 MED ORDER — OXYCODONE HCL 5 MG PO TABS
5.0000 mg | ORAL_TABLET | Freq: Once | ORAL | Status: AC
  Administered 2023-07-15: 5 mg via ORAL

## 2023-07-15 MED ORDER — SODIUM CHLORIDE 0.9 % IR SOLN
Status: DC | PRN
  Administered 2023-07-15: 1000 mL

## 2023-07-15 MED ORDER — PHENYLEPHRINE 80 MCG/ML (10ML) SYRINGE FOR IV PUSH (FOR BLOOD PRESSURE SUPPORT)
PREFILLED_SYRINGE | INTRAVENOUS | Status: DC | PRN
  Administered 2023-07-15: 120 ug via INTRAVENOUS

## 2023-07-15 MED ORDER — DEXTROSE 50 % IV SOLN
25.0000 mL | Freq: Once | INTRAVENOUS | Status: AC
  Administered 2023-07-15: 25 mL via INTRAVENOUS

## 2023-07-15 MED ORDER — HYDROCODONE-ACETAMINOPHEN 5-325 MG PO TABS: 1.0000 | ORAL_TABLET | ORAL | 0 refills | Status: DC | PRN

## 2023-07-15 MED ORDER — FENTANYL CITRATE (PF) 100 MCG/2ML IJ SOLN
INTRAMUSCULAR | Status: AC
  Filled 2023-07-15: qty 2

## 2023-07-15 MED ORDER — CHLORHEXIDINE GLUCONATE 0.12 % MT SOLN
15.0000 mL | Freq: Once | OROMUCOSAL | Status: AC
  Administered 2023-07-15: 15 mL via OROMUCOSAL

## 2023-07-15 MED ORDER — KETOROLAC TROMETHAMINE 30 MG/ML IJ SOLN
INTRAMUSCULAR | Status: AC
  Filled 2023-07-15: qty 1

## 2023-07-15 MED ORDER — SILVER NITRATE-POT NITRATE 75-25 % EX MISC
CUTANEOUS | Status: AC
  Filled 2023-07-15: qty 10

## 2023-07-15 MED ORDER — LIDOCAINE HCL (PF) 2 % IJ SOLN
INTRAMUSCULAR | Status: AC
  Filled 2023-07-15: qty 5

## 2023-07-15 MED ORDER — ONDANSETRON HCL 4 MG/2ML IJ SOLN
INTRAMUSCULAR | Status: AC
  Filled 2023-07-15: qty 2

## 2023-07-15 MED ORDER — ONDANSETRON 4 MG PO TBDP: 4.0000 mg | ORAL_TABLET | Freq: Four times a day (QID) | ORAL | 0 refills | Status: DC | PRN

## 2023-07-15 MED ORDER — ORAL CARE MOUTH RINSE: 15.0000 mL | Freq: Once | OROMUCOSAL | Status: AC

## 2023-07-15 MED ORDER — IBUPROFEN 600 MG PO TABS: 600.0000 mg | ORAL_TABLET | Freq: Four times a day (QID) | ORAL | 0 refills | Status: DC

## 2023-07-15 MED ORDER — DEXAMETHASONE SODIUM PHOSPHATE 10 MG/ML IJ SOLN
INTRAMUSCULAR | Status: DC | PRN
  Administered 2023-07-15: 10 mg via INTRAVENOUS

## 2023-07-15 MED ORDER — SILVER NITRATE-POT NITRATE 75-25 % EX MISC
CUTANEOUS | Status: DC | PRN
  Administered 2023-07-15: 2 via TOPICAL

## 2023-07-15 MED ORDER — ONDANSETRON HCL 4 MG/2ML IJ SOLN
4.0000 mg | Freq: Once | INTRAMUSCULAR | Status: AC | PRN
  Administered 2023-07-15: 4 mg via INTRAVENOUS

## 2023-07-15 MED ORDER — OXYCODONE HCL 5 MG PO TABS
ORAL_TABLET | ORAL | Status: AC
  Filled 2023-07-15: qty 1

## 2023-07-15 MED ORDER — PHENYLEPHRINE 80 MCG/ML (10ML) SYRINGE FOR IV PUSH (FOR BLOOD PRESSURE SUPPORT)
PREFILLED_SYRINGE | INTRAVENOUS | Status: AC
  Filled 2023-07-15: qty 10

## 2023-07-15 MED ORDER — PROPOFOL 10 MG/ML IV BOLUS
INTRAVENOUS | Status: DC | PRN
  Administered 2023-07-15: 200 mg via INTRAVENOUS

## 2023-07-15 MED ORDER — DEXAMETHASONE SODIUM PHOSPHATE 10 MG/ML IJ SOLN
INTRAMUSCULAR | Status: AC
  Filled 2023-07-15: qty 1

## 2023-07-15 MED ORDER — EPHEDRINE SULFATE-NACL 50-0.9 MG/10ML-% IV SOSY
PREFILLED_SYRINGE | INTRAVENOUS | Status: DC | PRN
  Administered 2023-07-15: 10 mg via INTRAVENOUS

## 2023-07-15 MED ORDER — PROPOFOL 1000 MG/100ML IV EMUL
INTRAVENOUS | Status: AC
  Filled 2023-07-15: qty 100

## 2023-07-15 MED ORDER — ONDANSETRON HCL 4 MG/2ML IJ SOLN
INTRAMUSCULAR | Status: DC | PRN
  Administered 2023-07-15: 4 mg via INTRAVENOUS

## 2023-07-15 MED ORDER — LACTATED RINGERS IV SOLN: INTRAVENOUS | Status: DC

## 2023-07-15 MED ORDER — LIDOCAINE HCL (CARDIAC) PF 100 MG/5ML IV SOSY
PREFILLED_SYRINGE | INTRAVENOUS | Status: DC | PRN
  Administered 2023-07-15: 50 mg via INTRAVENOUS

## 2023-07-15 MED ORDER — LACTATED RINGERS IV SOLN: INTRAVENOUS | Status: AC

## 2023-07-15 MED ORDER — ACETAMINOPHEN 10 MG/ML IV SOLN
INTRAVENOUS | Status: DC | PRN
  Administered 2023-07-15: 1000 mg via INTRAVENOUS

## 2023-07-15 MED ORDER — EPHEDRINE 5 MG/ML INJ
INTRAVENOUS | Status: AC
  Filled 2023-07-15: qty 5

## 2023-07-15 MED ORDER — DEXTROSE 50 % IV SOLN
INTRAVENOUS | Status: AC
  Filled 2023-07-15: qty 50

## 2023-07-15 MED ORDER — ACETAMINOPHEN 10 MG/ML IV SOLN
INTRAVENOUS | Status: AC
  Filled 2023-07-15: qty 100

## 2023-07-15 MED ORDER — MIDAZOLAM HCL 2 MG/2ML IJ SOLN
INTRAMUSCULAR | Status: AC
  Filled 2023-07-15: qty 2

## 2023-07-15 MED ORDER — KETOROLAC TROMETHAMINE 30 MG/ML IJ SOLN
INTRAMUSCULAR | Status: DC | PRN
  Administered 2023-07-15: 30 mg via INTRAVENOUS

## 2023-07-15 MED ORDER — FENTANYL CITRATE (PF) 100 MCG/2ML IJ SOLN
INTRAMUSCULAR | Status: DC | PRN
  Administered 2023-07-15 (×2): 50 ug via INTRAVENOUS

## 2023-07-15 MED ORDER — FENTANYL CITRATE (PF) 100 MCG/2ML IJ SOLN: 25.0000 ug | INTRAMUSCULAR | Status: DC | PRN

## 2023-07-15 MED ORDER — PROPOFOL 500 MG/50ML IV EMUL
INTRAVENOUS | Status: DC | PRN
  Administered 2023-07-15: 175 ug/kg/min via INTRAVENOUS

## 2023-07-15 MED ORDER — MIDAZOLAM HCL 2 MG/2ML IJ SOLN
INTRAMUSCULAR | Status: DC | PRN
  Administered 2023-07-15 (×2): 1 mg via INTRAVENOUS

## 2023-07-15 SURGICAL SUPPLY — 16 items
ABLATOR SURESOUND NOVASURE (ABLATOR) IMPLANT
CATH FOLEY 2WAY 5CC 16FR (CATHETERS) ×1
CATH URTH 16FR FL 2W BLN LF (CATHETERS) IMPLANT
DRSG TELFA 3X8 NADH STRL (GAUZE/BANDAGES/DRESSINGS) IMPLANT
GLOVE BIO SURGEON STRL SZ7 (GLOVE) ×1 IMPLANT
GLOVE BIOGEL PI IND STRL 7.5 (GLOVE) ×1 IMPLANT
GOWN STRL REUS W/ TWL LRG LVL3 (GOWN DISPOSABLE) ×2 IMPLANT
GOWN STRL REUS W/TWL LRG LVL3 (GOWN DISPOSABLE) ×2
IV NS IRRIG 3000ML ARTHROMATIC (IV SOLUTION) ×1 IMPLANT
KIT PROCEDURE FLUENT (KITS) ×1 IMPLANT
KIT TURNOVER CYSTO (KITS) ×1 IMPLANT
PACK DNC HYST (MISCELLANEOUS) ×1 IMPLANT
PAD OB MATERNITY 4.3X12.25 (PERSONAL CARE ITEMS) ×1 IMPLANT
PAD PREP OB/GYN DISP 24X41 (PERSONAL CARE ITEMS) ×1 IMPLANT
SCRUB CHG 4% DYNA-HEX 4OZ (MISCELLANEOUS) ×1 IMPLANT
SEAL ROD LENS SCOPE MYOSURE (ABLATOR) ×1 IMPLANT

## 2023-07-15 NOTE — Anesthesia Preprocedure Evaluation (Signed)
Anesthesia Evaluation  Patient identified by MRN, date of birth, ID band Patient awake    Reviewed: Allergy & Precautions, NPO status , Patient's Chart, lab work & pertinent test results  Airway Mallampati: II  TM Distance: >3 FB Neck ROM: full    Dental  (+) Teeth Intact   Pulmonary neg pulmonary ROS, Patient abstained from smoking., former smoker   Pulmonary exam normal        Cardiovascular Exercise Tolerance: Good negative cardio ROS Normal cardiovascular exam Rhythm:Regular Rate:Normal     Neuro/Psych  Headaches  Anxiety     negative neurological ROS  negative psych ROS   GI/Hepatic negative GI ROS, Neg liver ROS, PUD,,,  Endo/Other  negative endocrine ROS    Renal/GU negative Renal ROS  negative genitourinary   Musculoskeletal   Abdominal Normal abdominal exam  (+)   Peds negative pediatric ROS (+)  Hematology negative hematology ROS (+) Blood dyscrasia, anemia   Anesthesia Other Findings Past Medical History: No date: Anemia No date: Anxiety No date: Headache No date: Lupus No date: Menorrhagia with irregular cycle No date: PONV (postoperative nausea and vomiting)     Comment:  slow to wake up No date: Ulcerative colitis (HCC) No date: Vitamin B12 deficiency No date: Vitamin D deficiency  Past Surgical History: 05/30/2020: BREAST BIOPSY; Left     Comment:  [place] bx, papilloma, vision marker 06/21/2020: BREAST BIOPSY WITH RADIO FREQUENCY LOCALIZER; Left     Comment:  Procedure: BREAST BIOPSY WITH RADIO FREQUENCY LOCALIZER;              Surgeon: Sung Amabile, DO;  Location: [facility] ORS;  Service:              General;  Laterality: Left; 06/21/2020: BREAST EXCISIONAL BIOPSY; Left     Comment:  papilloma, negative for atypia and malignancy No date: BREAST LUMPECTOMY; Left     Comment:  10/ 2021 No date: CESAREAN SECTION     Comment:  x3 No date: TONSILLECTOMY No date: TONSILLECTOMY AND  ADENOIDECTOMY No date: TUBAL LIGATION; Bilateral     Reproductive/Obstetrics negative OB ROS                             Anesthesia Physical Anesthesia Plan  ASA: 2  Anesthesia Plan: General   Post-op Pain Management:    Induction: Intravenous  PONV Risk Score and Plan: 1 and Ondansetron and Dexamethasone  Airway Management Planned: LMA  Additional Equipment:   Intra-op Plan:   Post-operative Plan: Extubation in OR  Informed Consent: I have reviewed the patients History and Physical, chart, labs and discussed the procedure including the risks, benefits and alternatives for the proposed anesthesia with the patient or authorized representative who has indicated his/her understanding and acceptance.     Dental Advisory Given  Plan Discussed with: CRNA and Surgeon  Anesthesia Plan Comments:        Anesthesia Quick Evaluation

## 2023-07-15 NOTE — Discharge Instructions (Signed)

## 2023-07-15 NOTE — Anesthesia Postprocedure Evaluation (Signed)
Anesthesia Post Note  Patient: Diana Ramos  Procedure(s) Performed: DILATATION & CURETTAGE/HYSTEROSCOPY WITH NOVASURE ABLATION (Vagina )  Patient location during evaluation: PACU Anesthesia Type: General Level of consciousness: awake and awake and alert Pain management: satisfactory to patient Vital Signs Assessment: post-procedure vital signs reviewed and stable Respiratory status: spontaneous breathing and nonlabored ventilation Cardiovascular status: blood pressure returned to baseline Anesthetic complications: no   No notable events documented.   Last Vitals:  Vitals:   07/15/23 0900 07/15/23 0915  BP: (!) 100/53 (!) 116/46  Pulse: 68 63  Resp: 19 13  Temp:    SpO2: 98% 99%    Last Pain:  Vitals:   07/15/23 0915  TempSrc:   PainSc: 6                  VAN STAVEREN,Francella Barnett

## 2023-07-15 NOTE — Interval H&P Note (Signed)
History and Physical Interval Note:  07/15/2023 7:21 AM  Diana Ramos  has presented today for surgery, with the diagnosis of menorrhagia with irregular cycle.  The various methods of treatment have been discussed with the patient and family. After consideration of risks, benefits and other options for treatment, the patient has consented to  Procedure(s): DILATATION & CURETTAGE/HYSTEROSCOPY WITH NOVASURE ABLATION (N/A) as a surgical intervention.  The patient's history has been reviewed, patient examined, no change in status, stable for surgery.  I have reviewed the patient's chart and labs.  Questions were answered to the patient's satisfaction.    Thomasene Mohair, MD, [facility] 07/15/2023 7:21 AM

## 2023-07-15 NOTE — Progress Notes (Signed)
Pt arrived and states she feels unwell. Pt states she feels sweaty and shaky. Pt states she suffers from hypoglycemia. This RN checked CBG: 62. Dr. Suzan Slick notified. Acknowledged. Orders received. See MAR.

## 2023-07-15 NOTE — OR Nursing (Signed)
In and out urinary catheter performed at case start by surgeon with approximately 50 mL clear urine returned.

## 2023-07-15 NOTE — Op Note (Signed)
Operative Note    Name: Diana Ramos   Date of Service: 07/15/2023  DOB: April 12, 1986   MRN: [id number]    PRE-OP DIAGNOSIS: Menorrhagia with irregular cycle  POST-OP DIAGNOSIS: Menorrhagia with irregular cycle  SURGEON: Conard Novak, MD  PROCEDURE: DILATATION & CURETTAGE/HYSTEROSCOPY WITH NOVASURE ABLATION  ANESTHESIA: General   ESTIMATED BLOOD LOSS: 5 mL   IV Fluid: 700 mL crystalloid  SPECIMENS: endometrial curettings  FLUID DEFICIT: 75 mL  URINE OUTPUT: 50 mL  COMPLICATIONS: none   DISPOSITION: PACU - hemodynamically stable.   CONDITION: stable   FINDINGS: Exam under anesthesia revealed small, mobile uterus with no masses and bilateral adnexa without masses or fullness. Hysteroscopy revealed a grossly normal appearing uterine cavity with bilateral tubal ostia and normal appearing endocervical canal. Findings after ablation revealed globally ablated endometrium.    PROCEDURE IN DETAIL: After informed consent was obtained, the patient was taken to the operating room where anesthesia was obtained without difficulty. The patient was positioned in the dorsal lithotomy position in [place] stirrups. The patient's bladder was catheterized with an in and out foley catheter. The patient was examined under anesthesia, with the above noted findings. The bivalved speculum was placed inside the patient's vagina, and the the anterior lip of the cervix was grasped with the tenaculum. The uterine cavity was sounded to 7.5 cm, and then the cervix was progressively dilated to 7 mm using Hegar dilators. The MyoSure hysteroscope was introduced, with LR fluid used to distend the intrauterine cavity, with the above noted findings.   The hystersocope was removed and the uterine cavity was curetted until a gritty texture was noted, yielding endometrial curettings.    The NovaSure device was then placed without difficulty. Measurements were obtained. Patient was noted to have a uterine length of  4.5 cm, a cervical length of 3 cm, and a uterine width of 3.6 cm. The NovaSure device was first tested and after confirmation the procedures performed. Length of procedure was 120 seconds (time-out after 2 minutes). The NovaSure device is then removed and repeat hysteroscopy reveals an appropriate lining of the uterus and no perforation or injury. Hysteroscope is removed with minimal discrepancy of fluid, as noted above.   Tenaculum was removed with excellent hemostasis noted after application of silver nitrate to the tenaculum entry sites. She was then taken out of dorsal lithotomy. Hemostasis noted.  The patient tolerated the procedure well. Sponge, lap and needle counts were correct x2. The patient was taken to recovery room in excellent condition.  Thomasene Mohair, MD 07/15/2023 8:23 AM

## 2023-07-15 NOTE — Transfer of Care (Signed)
Immediate Anesthesia Transfer of Care Note  Patient: Diana Ramos  Procedure(s) Performed: DILATATION & CURETTAGE/HYSTEROSCOPY WITH NOVASURE ABLATION (Vagina )  Patient Location: PACU  Anesthesia Type:General  Level of Consciousness: awake, oriented, and patient cooperative  Airway & Oxygen Therapy: Patient Spontanous Breathing and Patient connected to face mask oxygen  Post-op Assessment: Report given to RN and Post -op Vital signs reviewed and stable  Post vital signs: Reviewed and stable  Last Vitals:  Vitals Value Taken Time  BP 90/43 07/15/23 0832  Temp    Pulse 65 07/15/23 0833  Resp 14 07/15/23 0833  SpO2 100 % 07/15/23 0833  Vitals shown include unfiled device data.  Last Pain:  Vitals:   07/15/23 0641  TempSrc: Temporal  PainSc: 0-No pain         Complications: No notable events documented.

## 2023-07-15 NOTE — Anesthesia Procedure Notes (Signed)
Procedure Name: LMA Insertion Date/Time: 07/15/2023 7:42 AM  Performed by: Jeannene Patella, CRNAPre-anesthesia Checklist: Patient identified, Emergency Drugs available, Suction available, Patient being monitored and Timeout performed Patient Re-evaluated:Patient Re-evaluated prior to induction Oxygen Delivery Method: Circle system utilized Preoxygenation: Pre-oxygenation with 100% oxygen Induction Type: IV induction LMA: LMA inserted LMA Size: 3.0 Number of attempts: 1 Placement Confirmation: positive ETCO2 and breath sounds checked- equal and bilateral Tube secured with: Tape Dental Injury: Teeth and Oropharynx as per pre-operative assessment  Comments: Soft gauze roll left molars

## 2023-07-16 LAB — SURGICAL PATHOLOGY

## 2023-07-19 ENCOUNTER — Other Ambulatory Visit: Payer: Managed Care, Other (non HMO)

## 2023-07-20 ENCOUNTER — Ambulatory Visit: Payer: Managed Care, Other (non HMO)

## 2023-07-20 ENCOUNTER — Ambulatory Visit: Payer: Managed Care, Other (non HMO) | Admitting: Oncology

## 2023-07-28 ENCOUNTER — Other Ambulatory Visit: Payer: Self-pay

## 2023-07-28 MED ORDER — TIZANIDINE HCL 2 MG PO TABS
2.0000 mg | ORAL_TABLET | Freq: Three times a day (TID) | ORAL | 1 refills | Status: DC
  Filled 2023-07-28: qty 45, 15d supply, fill #0

## 2023-07-28 MED ORDER — CLONAZEPAM 0.5 MG PO TABS
0.5000 mg | ORAL_TABLET | Freq: Two times a day (BID) | ORAL | 0 refills | Status: DC | PRN
  Filled 2023-07-28: qty 45, 23d supply, fill #0

## 2023-08-18 ENCOUNTER — Other Ambulatory Visit: Payer: Self-pay | Admitting: Physician Assistant

## 2023-08-18 ENCOUNTER — Ambulatory Visit
Admission: RE | Admit: 2023-08-18 | Discharge: 2023-08-18 | Disposition: A | Discharge status: Home / Self Care | Payer: Managed Care, Other (non HMO) | Source: Ambulatory Visit | Attending: Physician Assistant | Admitting: Physician Assistant

## 2023-08-18 ENCOUNTER — Other Ambulatory Visit
Admission: RE | Admit: 2023-08-18 | Discharge: 2023-08-18 | Disposition: A | Discharge status: Home / Self Care | Payer: Managed Care, Other (non HMO) | Source: Ambulatory Visit | Attending: Physician Assistant | Admitting: Physician Assistant

## 2023-08-18 DIAGNOSIS — M79604 Pain in right leg: Secondary | ICD-10-CM

## 2023-08-18 DIAGNOSIS — M7989 Other specified soft tissue disorders: Secondary | ICD-10-CM | POA: Diagnosis present

## 2023-08-18 DIAGNOSIS — R0602 Shortness of breath: Secondary | ICD-10-CM | POA: Insufficient documentation

## 2023-08-18 LAB — D-DIMER, QUANTITATIVE: D-Dimer, Quant: 0.28 ug{FEU}/mL (ref 0.00–0.50)

## 2023-08-23 ENCOUNTER — Other Ambulatory Visit: Payer: Self-pay

## 2023-08-23 MED ORDER — VITAMIN D (ERGOCALCIFEROL) 1.25 MG (50000 UNIT) PO CAPS
50000.0000 [IU] | ORAL_CAPSULE | ORAL | 0 refills | Status: DC
  Filled 2023-08-23: qty 12, 84d supply, fill #0

## 2023-08-24 ENCOUNTER — Inpatient Hospital Stay: Payer: Managed Care, Other (non HMO) | Attending: Oncology

## 2023-08-24 ENCOUNTER — Other Ambulatory Visit: Payer: Self-pay

## 2023-08-24 DIAGNOSIS — D500 Iron deficiency anemia secondary to blood loss (chronic): Secondary | ICD-10-CM | POA: Insufficient documentation

## 2023-08-24 DIAGNOSIS — N920 Excessive and frequent menstruation with regular cycle: Secondary | ICD-10-CM | POA: Diagnosis present

## 2023-08-24 LAB — IRON AND TIBC
Iron: 68 ug/dL (ref 28–170)
Saturation Ratios: 17 % (ref 10.4–31.8)
TIBC: 402 ug/dL (ref 250–450)
UIBC: 334 ug/dL

## 2023-08-24 LAB — CBC WITH DIFFERENTIAL (CANCER CENTER ONLY)
Abs Immature Granulocytes: 0.04 10*3/uL (ref 0.00–0.07)
Basophils Absolute: 0 10*3/uL (ref 0.0–0.1)
Basophils Relative: 0 %
Eosinophils Absolute: 0.1 10*3/uL (ref 0.0–0.5)
Eosinophils Relative: 2 %
HCT: 38 % (ref 36.0–46.0)
Hemoglobin: 12.1 g/dL (ref 12.0–15.0)
Immature Granulocytes: 1 %
Lymphocytes Relative: 24 %
Lymphs Abs: 1 10*3/uL (ref 0.7–4.0)
MCH: 28.3 pg (ref 26.0–34.0)
MCHC: 31.8 g/dL (ref 30.0–36.0)
MCV: 88.8 fL (ref 80.0–100.0)
Monocytes Absolute: 0.3 10*3/uL (ref 0.1–1.0)
Monocytes Relative: 7 %
Neutro Abs: 2.9 10*3/uL (ref 1.7–7.7)
Neutrophils Relative %: 66 %
Platelet Count: 228 10*3/uL (ref 150–400)
RBC: 4.28 MIL/uL (ref 3.87–5.11)
RDW: 12.9 % (ref 11.5–15.5)
WBC Count: 4.4 10*3/uL (ref 4.0–10.5)
nRBC: 0 % (ref 0.0–0.2)

## 2023-08-24 LAB — RETIC PANEL
Immature Retic Fract: 2.8 % (ref 2.3–15.9)
RBC.: 4.3 MIL/uL (ref 3.87–5.11)
Retic Count, Absolute: 25.8 10*3/uL (ref 19.0–186.0)
Retic Ct Pct: 0.6 % (ref 0.4–3.1)
Reticulocyte Hemoglobin: 31.6 pg (ref >27.9)

## 2023-08-24 LAB — FERRITIN: Ferritin: 20 ng/mL (ref 11–307)

## 2023-08-26 ENCOUNTER — Ambulatory Visit: Payer: Managed Care, Other (non HMO) | Admitting: Oncology

## 2023-08-26 ENCOUNTER — Ambulatory Visit: Payer: Managed Care, Other (non HMO)

## 2023-09-03 ENCOUNTER — Ambulatory Visit: Payer: Managed Care, Other (non HMO) | Admitting: Oncology

## 2023-09-03 ENCOUNTER — Ambulatory Visit: Payer: Managed Care, Other (non HMO)

## 2023-09-13 ENCOUNTER — Other Ambulatory Visit: Payer: Self-pay

## 2023-09-13 MED ORDER — AMOXICILLIN-POT CLAVULANATE 875-125 MG PO TABS
ORAL_TABLET | ORAL | 0 refills | Status: DC
  Filled 2023-09-13: qty 14, 7d supply, fill #0

## 2023-09-14 ENCOUNTER — Other Ambulatory Visit: Payer: Self-pay

## 2023-09-14 MED ORDER — CLONAZEPAM 0.5 MG PO TABS
0.5000 mg | ORAL_TABLET | Freq: Two times a day (BID) | ORAL | 0 refills | Status: DC | PRN
  Filled 2023-09-14: qty 45, 23d supply, fill #0

## 2023-09-14 MED ORDER — ONDANSETRON 4 MG PO TBDP
4.0000 mg | ORAL_TABLET | Freq: Three times a day (TID) | ORAL | 1 refills | Status: DC | PRN
  Filled 2023-09-14: qty 20, 7d supply, fill #0

## 2023-09-26 ENCOUNTER — Encounter: Payer: Self-pay | Admitting: Oncology

## 2023-09-26 ENCOUNTER — Encounter: Payer: Self-pay | Admitting: Dermatology

## 2023-10-17 ENCOUNTER — Encounter: Payer: Self-pay | Admitting: Dermatology

## 2023-10-20 ENCOUNTER — Ambulatory Visit: Payer: Managed Care, Other (non HMO) | Admitting: Oncology

## 2023-10-20 ENCOUNTER — Ambulatory Visit: Payer: Managed Care, Other (non HMO)

## 2023-10-21 ENCOUNTER — Ambulatory Visit: Payer: Managed Care, Other (non HMO) | Admitting: Dermatology

## 2023-11-05 ENCOUNTER — Other Ambulatory Visit: Payer: Self-pay

## 2023-11-05 MED ORDER — CLONAZEPAM 0.5 MG PO TABS
0.5000 mg | ORAL_TABLET | Freq: Two times a day (BID) | ORAL | 3 refills | Status: DC | PRN
  Filled 2023-11-05: qty 45, 23d supply, fill #0
  Filled 2024-01-02: qty 45, 23d supply, fill #1
  Filled 2024-02-10: qty 45, 23d supply, fill #2
  Filled 2024-03-12: qty 45, 23d supply, fill #3

## 2023-11-08 ENCOUNTER — Ambulatory Visit
Admission: RE | Admit: 2023-11-08 | Discharge: 2023-11-08 | Disposition: A | Discharge status: Home / Self Care | Payer: Managed Care, Other (non HMO) | Source: Ambulatory Visit | Attending: Family Medicine | Admitting: Family Medicine

## 2023-11-08 ENCOUNTER — Other Ambulatory Visit
Admission: RE | Admit: 2023-11-08 | Discharge: 2023-11-08 | Disposition: A | Discharge status: Home / Self Care | Payer: Managed Care, Other (non HMO) | Source: Ambulatory Visit | Attending: Family Medicine | Admitting: Family Medicine

## 2023-11-08 ENCOUNTER — Other Ambulatory Visit: Payer: Self-pay | Admitting: Family Medicine

## 2023-11-08 DIAGNOSIS — R112 Nausea with vomiting, unspecified: Secondary | ICD-10-CM | POA: Diagnosis present

## 2023-11-08 DIAGNOSIS — R1013 Epigastric pain: Secondary | ICD-10-CM | POA: Insufficient documentation

## 2023-11-08 DIAGNOSIS — M329 Systemic lupus erythematosus, unspecified: Secondary | ICD-10-CM | POA: Insufficient documentation

## 2023-11-08 DIAGNOSIS — R1032 Left lower quadrant pain: Secondary | ICD-10-CM | POA: Diagnosis present

## 2023-11-08 DIAGNOSIS — K51919 Ulcerative colitis, unspecified with unspecified complications: Secondary | ICD-10-CM

## 2023-11-08 DIAGNOSIS — R110 Nausea: Secondary | ICD-10-CM

## 2023-11-08 LAB — CBC WITH DIFFERENTIAL/PLATELET
Abs Immature Granulocytes: 0.02 10*3/uL (ref 0.00–0.07)
Basophils Absolute: 0 10*3/uL (ref 0.0–0.1)
Basophils Relative: 0 %
Eosinophils Absolute: 0.1 10*3/uL (ref 0.0–0.5)
Eosinophils Relative: 1 %
HCT: 39.1 % (ref 36.0–46.0)
Hemoglobin: 12.6 g/dL (ref 12.0–15.0)
Immature Granulocytes: 0 %
Lymphocytes Relative: 16 %
Lymphs Abs: 1.2 10*3/uL (ref 0.7–4.0)
MCH: 29 pg (ref 26.0–34.0)
MCHC: 32.2 g/dL (ref 30.0–36.0)
MCV: 90.1 fL (ref 80.0–100.0)
Monocytes Absolute: 0.4 10*3/uL (ref 0.1–1.0)
Monocytes Relative: 6 %
Neutro Abs: 6 10*3/uL (ref 1.7–7.7)
Neutrophils Relative %: 77 %
Platelets: 233 10*3/uL (ref 150–400)
RBC: 4.34 MIL/uL (ref 3.87–5.11)
RDW: 13.9 % (ref 11.5–15.5)
WBC: 7.7 10*3/uL (ref 4.0–10.5)
nRBC: 0 % (ref 0.0–0.2)

## 2023-11-08 MED ORDER — IOHEXOL 300 MG/ML  SOLN
100.0000 mL | Freq: Once | INTRAMUSCULAR | Status: AC | PRN
  Administered 2023-11-08: 100 mL via INTRAVENOUS

## 2023-11-12 ENCOUNTER — Other Ambulatory Visit: Payer: Self-pay

## 2023-11-12 MED ORDER — AMOXICILLIN-POT CLAVULANATE 875-125 MG PO TABS
875.0000 mg | ORAL_TABLET | Freq: Two times a day (BID) | ORAL | 0 refills | Status: DC
Start: 2023-11-12 — End: 2023-11-16
  Filled 2023-11-12: qty 14, 7d supply, fill #0

## 2023-11-15 ENCOUNTER — Other Ambulatory Visit: Payer: Self-pay

## 2023-11-15 MED ORDER — ERGOCALCIFEROL 1.25 MG (50000 UT) PO CAPS
50000.0000 [IU] | ORAL_CAPSULE | ORAL | 0 refills | Status: DC
  Filled 2023-11-15: qty 12, 84d supply, fill #0

## 2023-11-23 ENCOUNTER — Inpatient Hospital Stay: Payer: Managed Care, Other (non HMO) | Attending: Oncology

## 2023-11-23 DIAGNOSIS — D500 Iron deficiency anemia secondary to blood loss (chronic): Secondary | ICD-10-CM | POA: Diagnosis present

## 2023-11-23 DIAGNOSIS — Z87891 Personal history of nicotine dependence: Secondary | ICD-10-CM | POA: Insufficient documentation

## 2023-11-23 DIAGNOSIS — E538 Deficiency of other specified B group vitamins: Secondary | ICD-10-CM

## 2023-11-23 DIAGNOSIS — N920 Excessive and frequent menstruation with regular cycle: Secondary | ICD-10-CM | POA: Diagnosis present

## 2023-11-23 LAB — CBC WITH DIFFERENTIAL (CANCER CENTER ONLY)
Abs Immature Granulocytes: 0.02 10*3/uL (ref 0.00–0.07)
Basophils Absolute: 0 10*3/uL (ref 0.0–0.1)
Basophils Relative: 1 %
Eosinophils Absolute: 0.2 10*3/uL (ref 0.0–0.5)
Eosinophils Relative: 2 %
HCT: 39.6 % (ref 36.0–46.0)
Hemoglobin: 12.8 g/dL (ref 12.0–15.0)
Immature Granulocytes: 0 %
Lymphocytes Relative: 21 %
Lymphs Abs: 1.4 10*3/uL (ref 0.7–4.0)
MCH: 29 pg (ref 26.0–34.0)
MCHC: 32.3 g/dL (ref 30.0–36.0)
MCV: 89.6 fL (ref 80.0–100.0)
Monocytes Absolute: 0.5 10*3/uL (ref 0.1–1.0)
Monocytes Relative: 8 %
Neutro Abs: 4.5 10*3/uL (ref 1.7–7.7)
Neutrophils Relative %: 68 %
Platelet Count: 272 10*3/uL (ref 150–400)
RBC: 4.42 MIL/uL (ref 3.87–5.11)
RDW: 13.6 % (ref 11.5–15.5)
WBC Count: 6.6 10*3/uL (ref 4.0–10.5)
nRBC: 0 % (ref 0.0–0.2)

## 2023-11-23 LAB — VITAMIN B12: Vitamin B-12: 312 pg/mL (ref 180–914)

## 2023-11-23 LAB — IRON AND TIBC
Iron: 78 ug/dL (ref 28–170)
Saturation Ratios: 19 % (ref 10.4–31.8)
TIBC: 410 ug/dL (ref 250–450)
UIBC: 332 ug/dL

## 2023-11-23 LAB — FERRITIN: Ferritin: 21 ng/mL (ref 11–307)

## 2023-11-30 ENCOUNTER — Inpatient Hospital Stay (HOSPITAL_BASED_OUTPATIENT_CLINIC_OR_DEPARTMENT_OTHER): Payer: Managed Care, Other (non HMO) | Admitting: Oncology

## 2023-11-30 ENCOUNTER — Inpatient Hospital Stay: Payer: Managed Care, Other (non HMO)

## 2023-11-30 ENCOUNTER — Encounter: Payer: Self-pay | Admitting: Oncology

## 2023-11-30 VITALS — BP 107/74 | HR 65 | Temp 96.4°F | Resp 18 | Wt 133.2 lb

## 2023-11-30 DIAGNOSIS — D500 Iron deficiency anemia secondary to blood loss (chronic): Secondary | ICD-10-CM

## 2023-11-30 DIAGNOSIS — E538 Deficiency of other specified B group vitamins: Secondary | ICD-10-CM | POA: Diagnosis not present

## 2023-11-30 NOTE — Assessment & Plan Note (Addendum)
 Labs are reviewed and discussed with patient. Lab Results  Component Value Date   HGB 12.8 11/23/2023   TIBC 410 11/23/2023   IRONPCTSAT 19 11/23/2023   FERRITIN 21 11/23/2023     Hold off Venofer

## 2023-11-30 NOTE — Assessment & Plan Note (Addendum)
 Continue vitamin B-12 1000 mcg monthly, managed by her primary

## 2023-11-30 NOTE — Progress Notes (Signed)
 Hematology/Oncology Progress note [phone] [phone]     CHIEF COMPLAINTS/REASON FOR VISIT:  Follow-up for iron deficiency anemia.   ASSESSMENT & PLAN:   Iron deficiency anemia due to chronic blood loss Labs are reviewed and discussed with patient. Lab Results  Component Value Date   HGB 12.8 11/23/2023   TIBC 410 11/23/2023   IRONPCTSAT 19 11/23/2023   FERRITIN 21 11/23/2023     Hold off Venofer   Vitamin B12 deficiency Continue vitamin B-12 1000 mcg monthly, managed by her primary   Follow up PRN All questions were answered. The patient knows to call the clinic with any problems, questions or concerns.  Diana Patience, MD, PhD [facility] Hematology Oncology 11/30/2023     HISTORY OF PRESENTING ILLNESS:   Diana Ramos is a  38 y.o.  female with PMH listed below was seen in consultation at the request of  Fields, Lisabeth Pick, NP  for evaluation of anemia.   Chronic anemia, 07/01/2021 cbc showed Hb of 9.6, mcv 80.4.  Her previous blood work results with her PCP were not available to me.  Patient reports that she can not tolerate oral iron supplementation.   History of ulcerarive colitis, patient reports that symptoms are controlled, no black or bloody stool.  Patient has lupus and follows up with Dr.Patel.  On Plaquenil.   She has heavy menstrual periods. She has seen Gyn and is not interested in surgical options. She can not tolerate oral contraceptives due to developing skin rash.   Patient follows up with rheumatology for lupus.  ESR is normal.  Patient is on Plaquenil and Imuran  INTERVAL HISTORY Diana Ramos is a 38 y.o. female who has above history reviewed by me today presents for follow up visit for management of iron deficiency anemia. Patient has previously received IV Venofer treatments and tolerates well Her fatigue improved 07/15/2023 s/p D&C. Patient reports that menstrual bleeding is light, much improved.   Review of  Systems  Constitutional:  Negative for appetite change, chills, fatigue and fever.  HENT:   Negative for hearing loss and voice change.   Eyes:  Negative for eye problems.  Respiratory:  Negative for chest tightness and cough.   Cardiovascular:  Negative for chest pain.  Gastrointestinal:  Negative for abdominal distention, abdominal pain and blood in stool.  Endocrine: Negative for hot flashes.  Genitourinary:  Negative for difficulty urinating, frequency and menstrual problem.   Musculoskeletal:  Positive for arthralgias.  Skin:  Negative for itching and rash.  Neurological:  Negative for extremity weakness.  Hematological:  Negative for adenopathy.  Psychiatric/Behavioral:  Negative for confusion.     MEDICAL HISTORY:  Past Medical History:  Diagnosis Date   Anemia    Anxiety    Headache    Lupus    Menorrhagia with irregular cycle    PONV (postoperative nausea and vomiting)    slow to wake up   Ulcerative colitis (HCC)    Vitamin B12 deficiency    Vitamin D deficiency     SURGICAL HISTORY: Past Surgical History:  Procedure Laterality Date   BREAST BIOPSY Left 05/30/2020   [place] bx, papilloma, vision marker   BREAST BIOPSY WITH RADIO FREQUENCY LOCALIZER Left 06/21/2020   Procedure: BREAST BIOPSY WITH RADIO FREQUENCY LOCALIZER;  Surgeon: Sung Amabile, DO;  Location: [facility] ORS;  Service: General;  Laterality: Left;   BREAST EXCISIONAL BIOPSY Left 06/21/2020   papilloma, negative for atypia and malignancy   BREAST LUMPECTOMY Left  10/ 2021   CESAREAN SECTION     x3   DILITATION & CURRETTAGE/HYSTROSCOPY WITH NOVASURE ABLATION N/A 07/15/2023   Procedure: DILATATION & CURETTAGE/HYSTEROSCOPY WITH NOVASURE ABLATION;  Surgeon: Conard Novak, MD;  Location: [facility] ORS;  Service: Gynecology;  Laterality: N/A;   TONSILLECTOMY     TONSILLECTOMY AND ADENOIDECTOMY     TUBAL LIGATION Bilateral     SOCIAL HISTORY: Social History   Socioeconomic History   Marital status:  Married    Spouse name: Rocky Link   Number of children: 3   Years of education: Not on file   Highest education level: Not on file  Occupational History   Not on file  Tobacco Use   Smoking status: Former    Current packs/day: 0.00    Average packs/day: 0.5 packs/day for 3.0 years (1.5 ttl pk-yrs)    Types: Cigarettes    Start date: 2004    Quit date: 2007    Years since quitting: 18.2   Smokeless tobacco: Never  Vaping Use   Vaping status: Never Used  Substance and Sexual Activity   Alcohol use: Not Currently   Drug use: Not Currently    Types: Marijuana   Sexual activity: Not on file  Other Topics Concern   Not on file  Social History Narrative   Not on file   Social Drivers of Health   Financial Resource Strain: High Risk (11/16/2023)   Received from [facility]   Overall Financial Resource Strain (CARDIA)    Difficulty of Paying Living Expenses: Hard  Food Insecurity: Food Insecurity Present (11/16/2023)   Received from [facility]   Hunger Vital Sign    Worried About Running Out of Food in the Last Year: Sometimes true    Ran Out of Food in the Last Year: Often true  Transportation Needs: No Transportation Needs (11/16/2023)   Received from [facility] - Transportation    In the past 12 months, has lack of transportation kept you from medical appointments or from getting medications?: No    Lack of Transportation (Non-Medical): No  Physical Activity: Not on file  Stress: Not on file  Social Connections: Not on file  Intimate Partner Violence: Not on file    FAMILY HISTORY: Family History  Problem Relation Age of Onset   Hashimoto's thyroiditis Mother    Atrial fibrillation Father    Heart disease Father    Melanoma Brother    Atrial fibrillation Maternal Grandmother    Lung cancer Maternal Grandmother    Bladder Cancer Maternal Grandmother    Lung cancer Maternal Grandfather     ALLERGIES:  is  allergic to doxaphene [propoxyphene], doxycycline, ciprofloxacin, and tape.  MEDICATIONS:  Current Outpatient Medications  Medication Sig Dispense Refill   Adapalene (DIFFERIN) 0.3 % gel Apply a thin coat to face at bedtime. 45 g 5   baclofen (LIORESAL) 10 MG tablet Take by mouth.     cetirizine (ZYRTEC) 10 MG tablet Take 1 tablet (10 mg total) by mouth once daily 90 tablet 1   clonazePAM (KLONOPIN) 0.5 MG tablet Take 0.5 mg by mouth as needed.     diclofenac Sodium (VOLTAREN) 1 % GEL Apply 1 Application topically as needed.     eletriptan (RELPAX) 40 MG tablet Take by mouth.     ergocalciferol (VITAMIN D2) 1.25 MG (50000 UT) capsule Take 1 capsule (50,000 Units total) by mouth once a week. 12 capsule 0   hydroxychloroquine (  PLAQUENIL) 200 MG tablet Take 200 mg by mouth daily.     hyoscyamine (LEVSIN SL) 0.125 MG SL tablet Place 0.125 mg under the tongue every 4 (four) hours as needed (IBS).     omeprazole (PRILOSEC) 40 MG capsule Take by mouth.     ondansetron (ZOFRAN-ODT) 4 MG disintegrating tablet Take 1 tablet (4 mg total) by mouth every 8 (eight) hours as needed for nausea or vomiting. 12 tablet 0   sertraline (ZOLOFT) 25 MG tablet Take 25 mg by mouth daily. 50 mg     Vitamin D, Ergocalciferol, (DRISDOL) 1.25 MG (50000 UNIT) CAPS capsule Take 1 capsule (50,000 Units total) by mouth once a week. 12 capsule 0   ALPRAZolam (XANAX) 0.25 MG tablet Take 1 tablet (0.25 mg total) by mouth 2 (two) times daily as needed for sleep. (Patient not taking: Reported on 11/30/2023) 45 tablet 0   Belimumab (BENLYSTA IV) Inject into the vein. Unsure of the dose. Next dose due 07/09/23 (Patient not taking: Reported on 11/30/2023)     clindamycin (CLEOCIN T) 1 % SWAB Apply 1 Application topically daily to affected area as needed. 60 each 5   clonazePAM (KLONOPIN) 0.5 MG tablet Take 1 tablet (0.5 mg total) by mouth 2 (two) times daily as needed for Anxiety for up to 30 days. (Patient not taking: Reported on  11/30/2023) 45 tablet 0   clonazePAM (KLONOPIN) 0.5 MG tablet Take 1 tablet (0.5 mg total) by mouth 2 (two) times daily as needed for anxiety (Patient not taking: Reported on 11/30/2023) 45 tablet 0   clonazePAM (KLONOPIN) 0.5 MG tablet Take 1 tablet (0.5 mg total) by mouth 2 (two) times daily as needed for anxiety (Patient not taking: Reported on 11/30/2023) 45 tablet 3   cyanocobalamin (VITAMIN B12) 1000 MCG/ML injection Inject into the muscle. At the primary office. (Patient not taking: Reported on 11/30/2023)     cyclobenzaprine (FLEXERIL) 5 MG tablet Take 5 mg by mouth 3 (three) times daily as needed for muscle spasms (for jaw clenching). (Patient not taking: Reported on 11/30/2023)     fluticasone (FLONASE) 50 MCG/ACT nasal spray Place 1 spray into both nostrils daily. (Patient not taking: Reported on 11/30/2023)     HYDROcodone-acetaminophen (NORCO/VICODIN) 5-325 MG tablet Take 1 tablet by mouth every 4 (four) hours as needed for moderate pain (pain score 4-6). (Patient not taking: Reported on 11/30/2023) 7 tablet 0   ibuprofen (ADVIL) 600 MG tablet Take 1 tablet (600 mg total) by mouth every 6 (six) hours. (Patient not taking: Reported on 11/30/2023) 30 tablet 0   ketoconazole (NIZORAL) 2 % shampoo Shampoo onto skin let sit a few minutes then wash off. Use 3 days a week. (Patient not taking: Reported on 11/30/2023) 120 mL 5   levocetirizine (XYZAL) 5 MG tablet Take 1 tablet by mouth every evening. (Patient not taking: Reported on 11/30/2023)     montelukast (SINGULAIR) 10 MG tablet Take 10 mg by mouth daily. (Patient not taking: Reported on 11/30/2023)     mupirocin ointment (BACTROBAN) 2 % APPLY TO HEALING WOUNDS AND COVER WITH BANDAGE ONE TIME DAILY UNTIL HEALED (Patient not taking: Reported on 11/30/2023) 22 g 0   ondansetron (ZOFRAN-ODT) 4 MG disintegrating tablet Take 1 tablet (4 mg total) by mouth every 6 (six) hours as needed for nausea. (Patient not taking: Reported on 11/30/2023) 20 tablet 0    ondansetron (ZOFRAN-ODT) 4 MG disintegrating tablet Take 1 tablet (4 mg total) by mouth every 8 (eight) hours as needed  for nausea (Patient not taking: Reported on 11/30/2023) 20 tablet 1   pantoprazole (PROTONIX) 40 MG tablet Take by mouth. (Patient not taking: Reported on 11/30/2023)     predniSONE (DELTASONE) 10 MG tablet Take 10 mg by mouth daily with breakfast. (Patient not taking: Reported on 11/30/2023)     predniSONE (DELTASONE) 10 MG tablet Take 4 tablets (40 mg total) by mouth once daily for 3 days, THEN 3 tablets (30 mg total) once daily for 3 days, THEN 2 tablets (20 mg total) once daily for 3 days, THEN 1 tablet (10 mg total) once daily for 3 days. (Patient not taking: Reported on 11/30/2023) 30 tablet 0   topiramate (TOPAMAX) 50 MG tablet Take 1 tablet by mouth at bedtime. (Patient not taking: Reported on 11/30/2023)     venlafaxine XR (EFFEXOR-XR) 37.5 MG 24 hr capsule Take by mouth.     vitamin C (ASCORBIC ACID) 500 MG tablet Take 500 mg by mouth daily. (Patient not taking: Reported on 11/30/2023)     VITAMIN D PO Take 1 tablet by mouth daily. (Patient not taking: Reported on 11/30/2023)     No current facility-administered medications for this visit.     PHYSICAL EXAMINATION:  Vitals:   11/30/23 1324  BP: 107/74  Pulse: 65  Resp: 18  Temp: (!) 96.4 F (35.8 C)  SpO2: 100%   Filed Weights   11/30/23 1324  Weight: 133 lb 3.2 oz (60.4 kg)    Physical Exam Constitutional:      General: She is not in acute distress. HENT:     Head: Normocephalic and atraumatic.  Eyes:     General: No scleral icterus. Cardiovascular:     Rate and Rhythm: Normal rate and regular rhythm.     Heart sounds: Normal heart sounds.  Pulmonary:     Effort: Pulmonary effort is normal. No respiratory distress.     Breath sounds: No wheezing.  Abdominal:     General: Bowel sounds are normal. There is no distension.     Palpations: Abdomen is soft.  Musculoskeletal:        General: No deformity.  Normal range of motion.     Cervical back: Normal range of motion and neck supple.  Skin:    General: Skin is warm and dry.     Coloration: Skin is not pale.  Neurological:     Mental Status: She is alert and oriented to person, place, and time. Mental status is at baseline.     Cranial Nerves: No cranial nerve deficit.  Psychiatric:        Mood and Affect: Mood normal.     LABORATORY DATA:  I have reviewed the data as listed    Latest Ref Rng & Units 11/23/2023    8:23 AM 11/08/2023   10:12 AM 08/24/2023    8:10 AM  CBC  WBC 4.0 - 10.5 K/uL 6.6  7.7  4.4   Hemoglobin 12.0 - 15.0 g/dL 75.6  43.3  29.5   Hematocrit 36.0 - 46.0 % 39.6  39.1  38.0   Platelets 150 - 400 K/uL 272  233  228       Latest Ref Rng & Units 04/09/2022    3:57 PM 07/21/2021    3:49 PM 01/02/2021    1:39 PM  CMP  Glucose 70 - 99 mg/dL 86  98  86   BUN 6 - 20 mg/dL 9  9  11    Creatinine 0.44 - 1.00 mg/dL 1.88  0.83  0.78   Sodium 135 - 145 mmol/L 139  139  141   Potassium 3.5 - 5.1 mmol/L 3.7  3.7  3.2   Chloride 98 - 111 mmol/L 105  102  105   CO2 22 - 32 mmol/L 24  29  26    Calcium 8.9 - 10.3 mg/dL 9.5  8.7  9.1   Total Protein 6.5 - 8.1 g/dL 8.4  8.4  7.8   Total Bilirubin 0.3 - 1.2 mg/dL 0.8  0.8  0.6   Alkaline Phos 38 - 126 U/L 43  49  40   AST 15 - 41 U/L 19  18  18    ALT 0 - 44 U/L 13  9  8      Iron/TIBC/Ferritin/ %Sat    Component Value Date/Time   IRON 78 11/23/2023 0823   TIBC 410 11/23/2023 0823   FERRITIN 21 11/23/2023 0823   IRONPCTSAT 19 11/23/2023 0823       RADIOGRAPHIC STUDIES: I have personally reviewed the radiological images as listed and agreed with the findings in the report. CT ABDOMEN PELVIS W CONTRAST Result Date: 11/08/2023 CLINICAL DATA:  Abdominal pain and nausea beginning yesterday. EXAM: CT ABDOMEN AND PELVIS WITH CONTRAST TECHNIQUE: Multidetector CT imaging of the abdomen and pelvis was performed using the standard protocol following bolus administration of  intravenous contrast. RADIATION DOSE REDUCTION: This exam was performed according to the departmental dose-optimization program which includes automated exposure control, adjustment of the mA and/or kV according to patient size and/or use of iterative reconstruction technique. CONTRAST:  OMNIPAQUE IOHEXOL 300 MG/ML  SOLN COMPARISON:  04/09/2022 FINDINGS: Lower Chest: No acute findings. Hepatobiliary: No suspicious hepatic masses identified. Gallbladder is unremarkable. No evidence of biliary ductal dilatation. Pancreas:  No mass or inflammatory changes. Spleen: Within normal limits in size and appearance. Adrenals/Urinary Tract: No suspicious masses identified. No evidence of ureteral calculi or hydronephrosis. Stomach/Bowel: No evidence of obstruction, inflammatory process or abnormal fluid collections. Vascular/Lymphatic: No pathologically enlarged lymph nodes. No acute vascular findings. Reproductive: A few tiny sub-cm low-attenuation foci are seen in the myometrium in the uterine fundus which may be due to tiny fibroids are adenomyosis. Benign-appearing left ovarian cyst is seen which measures 4.1 by 2.9 x 3.4 cm. Tiny amount of free fluid in the pelvic cul-de-sac is likely physiologic in a reproductive age female. Other:  None. Musculoskeletal:  No suspicious bone lesions identified. IMPRESSION: 4.1 cm benign-appearing left ovarian cyst and tiny amount of free fluid, most likely physiologic in a reproductive age female. No follow-up imaging recommended. Reference: JACR 2020 Feb; 17(2):248-254 Tiny uterine fibroids versus adenomyosis. Electronically Signed   By: Danae Orleans M.D.   On: 11/08/2023 15:31

## 2023-12-13 ENCOUNTER — Other Ambulatory Visit: Payer: Self-pay

## 2023-12-13 MED ORDER — HYDROXYCHLOROQUINE SULFATE 200 MG PO TABS
200.0000 mg | ORAL_TABLET | Freq: Every day | ORAL | 1 refills | Status: DC
  Filled 2023-12-13: qty 90, 90d supply, fill #0
  Filled 2024-03-12: qty 90, 90d supply, fill #1

## 2023-12-13 MED ORDER — PREDNISONE 10 MG PO TABS
ORAL_TABLET | ORAL | 0 refills | Status: DC
  Filled 2023-12-13: qty 30, 10d supply, fill #0

## 2023-12-22 ENCOUNTER — Other Ambulatory Visit: Payer: Self-pay

## 2023-12-23 ENCOUNTER — Other Ambulatory Visit: Payer: Self-pay

## 2023-12-23 MED ORDER — CETIRIZINE HCL 10 MG PO TABS
10.0000 mg | ORAL_TABLET | Freq: Every day | ORAL | 1 refills | Status: DC
  Filled 2023-12-23: qty 90, 90d supply, fill #0
  Filled 2024-03-12: qty 90, 90d supply, fill #1

## 2024-01-03 ENCOUNTER — Other Ambulatory Visit: Payer: Self-pay

## 2024-01-11 ENCOUNTER — Other Ambulatory Visit: Payer: Self-pay

## 2024-01-11 MED ORDER — METHOTREXATE SODIUM 2.5 MG PO TABS
10.0000 mg | ORAL_TABLET | ORAL | 2 refills | Status: DC
  Filled 2024-01-11: qty 16, 28d supply, fill #0

## 2024-01-11 MED ORDER — FOLIC ACID 1 MG PO TABS
1.0000 mg | ORAL_TABLET | Freq: Every day | ORAL | 3 refills | Status: DC
  Filled 2024-01-11: qty 90, 90d supply, fill #0

## 2024-01-13 ENCOUNTER — Other Ambulatory Visit: Payer: Self-pay

## 2024-01-13 ENCOUNTER — Emergency Department
Admission: EM | Admit: 2024-01-13 | Discharge: 2024-01-13 | Disposition: A | Discharge status: Home / Self Care | Attending: Emergency Medicine | Admitting: Emergency Medicine

## 2024-01-13 ENCOUNTER — Emergency Department

## 2024-01-13 DIAGNOSIS — R1110 Vomiting, unspecified: Secondary | ICD-10-CM | POA: Diagnosis present

## 2024-01-13 DIAGNOSIS — R197 Diarrhea, unspecified: Secondary | ICD-10-CM | POA: Diagnosis not present

## 2024-01-13 DIAGNOSIS — R112 Nausea with vomiting, unspecified: Secondary | ICD-10-CM | POA: Insufficient documentation

## 2024-01-13 LAB — CBC
HCT: 37.5 % (ref 36.0–46.0)
Hemoglobin: 12.7 g/dL (ref 12.0–15.0)
MCH: 30.5 pg (ref 26.0–34.0)
MCHC: 33.9 g/dL (ref 30.0–36.0)
MCV: 89.9 fL (ref 80.0–100.0)
Platelets: 237 10*3/uL (ref 150–400)
RBC: 4.17 MIL/uL (ref 3.87–5.11)
RDW: 13.4 % (ref 11.5–15.5)
WBC: 5.5 10*3/uL (ref 4.0–10.5)
nRBC: 0 % (ref 0.0–0.2)

## 2024-01-13 LAB — URINALYSIS, ROUTINE W REFLEX MICROSCOPIC
Bilirubin Urine: NEGATIVE
Glucose, UA: NEGATIVE mg/dL
Hgb urine dipstick: NEGATIVE
Ketones, ur: 5 mg/dL — AB
Leukocytes,Ua: NEGATIVE
Nitrite: NEGATIVE
Protein, ur: NEGATIVE mg/dL
Specific Gravity, Urine: 1.013 (ref 1.005–1.030)
pH: 8 (ref 5.0–8.0)

## 2024-01-13 LAB — COMPREHENSIVE METABOLIC PANEL WITH GFR
ALT: 10 U/L (ref 0–44)
AST: 27 U/L (ref 15–41)
Albumin: 4.3 g/dL (ref 3.5–5.0)
Alkaline Phosphatase: 43 U/L (ref 38–126)
Anion gap: 12 (ref 5–15)
BUN: 19 mg/dL (ref 6–20)
CO2: 17 mmol/L — ABNORMAL LOW (ref 22–32)
Calcium: 9.3 mg/dL (ref 8.9–10.3)
Chloride: 108 mmol/L (ref 98–111)
Creatinine, Ser: 0.77 mg/dL (ref 0.44–1.00)
GFR, Estimated: >60 mL/min (ref >60)
Glucose, Bld: 126 mg/dL — ABNORMAL HIGH (ref 70–99)
Potassium: 3.8 mmol/L (ref 3.5–5.1)
Sodium: 137 mmol/L (ref 135–145)
Total Bilirubin: 0.5 mg/dL (ref 0.0–1.2)
Total Protein: 7.8 g/dL (ref 6.5–8.1)

## 2024-01-13 LAB — LIPASE, BLOOD: Lipase: 28 U/L (ref 11–51)

## 2024-01-13 LAB — HCG, QUANTITATIVE, PREGNANCY: hCG, Beta Chain, Quant, S: <1 m[IU]/mL (ref <5)

## 2024-01-13 LAB — MAGNESIUM: Magnesium: 2.1 mg/dL (ref 1.7–2.4)

## 2024-01-13 MED ORDER — ONDANSETRON 4 MG PO TBDP
4.0000 mg | ORAL_TABLET | Freq: Three times a day (TID) | ORAL | 0 refills | Status: AC | PRN
  Filled 2024-01-13: qty 20, 7d supply, fill #0

## 2024-01-13 MED ORDER — ONDANSETRON HCL 4 MG/2ML IJ SOLN
4.0000 mg | Freq: Once | INTRAMUSCULAR | Status: AC
  Administered 2024-01-13: 4 mg via INTRAVENOUS
  Filled 2024-01-13: qty 2

## 2024-01-13 MED ORDER — SODIUM CHLORIDE 0.9 % IV BOLUS (SEPSIS)
1000.0000 mL | Freq: Once | INTRAVENOUS | Status: AC
  Administered 2024-01-13: 1000 mL via INTRAVENOUS

## 2024-01-13 MED ORDER — MORPHINE SULFATE (PF) 4 MG/ML IV SOLN
4.0000 mg | Freq: Once | INTRAVENOUS | Status: AC
  Administered 2024-01-13: 4 mg via INTRAVENOUS
  Filled 2024-01-13: qty 1

## 2024-01-13 MED ORDER — IOHEXOL 300 MG/ML  SOLN
100.0000 mL | Freq: Once | INTRAMUSCULAR | Status: AC | PRN
  Administered 2024-01-13: 100 mL via INTRAVENOUS

## 2024-01-13 NOTE — ED Notes (Signed)
 Pt back from ct via stretcher with ct tech

## 2024-01-13 NOTE — ED Triage Notes (Signed)
 Pt presents via EMS c/o mid abd pain and N/V starting last night. Reports unrelieved with PO Zofran . Reports hx of Lupus.

## 2024-01-13 NOTE — ED Provider Notes (Signed)
 [facility] Provider Note    Event Date/Time   First MD Initiated Contact with Patient 01/13/24 [phone]     (approximate)   History   Abdominal Pain   HPI  Diana Ramos is a 38 y.o. female with history of lupus on Plaquenil , ulcerative colitis, ovarian cysts, C-section x 3, tubal ligation who presents to the emergency department complaints of abdominal pain, vomiting and diarrhea that started today.  No sick contacts, recent travel or antibiotic use.  No dysuria, hematuria, vaginal bleeding or discharge.  She states she did have tingling in her face, hands, lips and felt like her hands were contracted earlier.   History provided by patient, EMS.    Past Medical History:  Diagnosis Date   Anemia    Anxiety    Headache    Lupus    Menorrhagia with irregular cycle    PONV (postoperative nausea and vomiting)    slow to wake up   Ulcerative colitis (HCC)    Vitamin B12 deficiency    Vitamin D  deficiency     Past Surgical History:  Procedure Laterality Date   BREAST BIOPSY Left 05/30/2020   us  bx, papilloma, vision marker   BREAST BIOPSY WITH RADIO FREQUENCY LOCALIZER Left 06/21/2020   Procedure: BREAST BIOPSY WITH RADIO FREQUENCY LOCALIZER;  Surgeon: Conrado Delay, DO;  Location: [facility] ORS;  Service: General;  Laterality: Left;   BREAST EXCISIONAL BIOPSY Left 06/21/2020   papilloma, negative for atypia and malignancy   BREAST LUMPECTOMY Left    10/ 2021   CESAREAN SECTION     x3   DILITATION & CURRETTAGE/HYSTROSCOPY WITH NOVASURE ABLATION N/A 07/15/2023   Procedure: DILATATION & CURETTAGE/HYSTEROSCOPY WITH NOVASURE ABLATION;  Surgeon: Kris Pester, MD;  Location: [facility] ORS;  Service: Gynecology;  Laterality: N/A;   TONSILLECTOMY     TONSILLECTOMY AND ADENOIDECTOMY     TUBAL LIGATION Bilateral     MEDICATIONS:  Prior to Admission medications   Medication Sig Start Date End Date Taking? Authorizing Provider  Adapalene  (DIFFERIN ) 0.3 %  gel Apply a thin coat to face at bedtime. 06/07/23   Harris Liming, MD  ALPRAZolam  (XANAX ) 0.25 MG tablet Take 1 tablet (0.25 mg total) by mouth 2 (two) times daily as needed for sleep. Patient not taking: Reported on 11/30/2023 06/13/23     baclofen (LIORESAL) 10 MG tablet Take by mouth. 11/17/23 11/16/24  [provider]  Belimumab (BENLYSTA IV) Inject into the vein. Unsure of the dose. Next dose due 07/09/23 Patient not taking: Reported on 11/30/2023    [provider]  cetirizine  (ZYRTEC ) 10 MG tablet Take 1 tablet (10 mg total) by mouth once daily 06/07/23     cetirizine  (ZYRTEC ) 10 MG tablet Take 1 tablet (10 mg total) by mouth once daily 12/23/23     clindamycin  (CLEOCIN  T) 1 % SWAB Apply 1 Application topically daily to affected area as needed. 06/07/23   Harris Liming, MD  clonazePAM  (KLONOPIN ) 0.5 MG tablet Take 0.5 mg by mouth as needed.    [provider]  clonazePAM  (KLONOPIN ) 0.5 MG tablet Take 1 tablet (0.5 mg total) by mouth 2 (two) times daily as needed for Anxiety for up to 30 days. Patient not taking: Reported on 11/30/2023 07/28/23     clonazePAM  (KLONOPIN ) 0.5 MG tablet Take 1 tablet (0.5 mg total) by mouth 2 (two) times daily as needed for anxiety Patient not taking: Reported on 11/30/2023 09/14/23     clonazePAM  (KLONOPIN ) 0.5 MG  tablet Take 1 tablet (0.5 mg total) by mouth 2 (two) times daily as needed for anxiety Patient not taking: Reported on 11/30/2023 11/05/23     cyanocobalamin  (VITAMIN B12) 1000 MCG/ML injection Inject into the muscle. At the primary office. Patient not taking: Reported on 11/30/2023 11/12/22   [provider]  cyclobenzaprine (FLEXERIL) 5 MG tablet Take 5 mg by mouth 3 (three) times daily as needed for muscle spasms (for jaw clenching). Patient not taking: Reported on 11/30/2023    [provider]  diclofenac Sodium (VOLTAREN) 1 % GEL Apply 1 Application topically as needed. 05/08/22   [provider]   eletriptan (RELPAX) 40 MG tablet Take by mouth.    [provider]  ergocalciferol  (VITAMIN D2) 1.25 MG (50000 UT) capsule Take 1 capsule (50,000 Units total) by mouth once a week. 11/15/23   Fields, Glenda L, NP  fluticasone (FLONASE) 50 MCG/ACT nasal spray Place 1 spray into both nostrils daily. Patient not taking: Reported on 11/30/2023 03/16/21   [provider]  folic acid  (FOLVITE ) 1 MG tablet Take 1 tablet (1 mg total) by mouth daily. 01/11/24     HYDROcodone -acetaminophen  (NORCO/VICODIN) 5-325 MG tablet Take 1 tablet by mouth every 4 (four) hours as needed for moderate pain (pain score 4-6). Patient not taking: Reported on 11/30/2023 07/15/23   Kris Pester, MD  hydroxychloroquine  (PLAQUENIL ) 200 MG tablet Take 200 mg by mouth daily.    [provider]  hydroxychloroquine  (PLAQUENIL ) 200 MG tablet Take 1 tablet (200 mg total) by mouth daily. 12/13/23   Patel, Mayur K, MD  hyoscyamine (LEVSIN SL) 0.125 MG SL tablet Place 0.125 mg under the tongue every 4 (four) hours as needed (IBS).    [provider]  ibuprofen  (ADVIL ) 600 MG tablet Take 1 tablet (600 mg total) by mouth every 6 (six) hours. Patient not taking: Reported on 11/30/2023 07/15/23   Kris Pester, MD  ketoconazole  (NIZORAL ) 2 % shampoo Shampoo onto skin let sit a few minutes then wash off. Use 3 days a week. Patient not taking: Reported on 11/30/2023 06/07/23   Harris Liming, MD  levocetirizine (XYZAL) 5 MG tablet Take 1 tablet by mouth every evening. Patient not taking: Reported on 11/30/2023 01/30/22 01/30/23  [provider]  methotrexate  (RHEUMATREX) 2.5 MG tablet Take 4 tablets (10 mg total) by mouth every 7 (seven) days. All on the same day. 01/11/24     montelukast (SINGULAIR) 10 MG tablet Take 10 mg by mouth daily. Patient not taking: Reported on 11/30/2023 02/23/21   [provider]  mupirocin  ointment (BACTROBAN ) 2 % APPLY TO HEALING WOUNDS AND COVER WITH  BANDAGE ONE TIME DAILY UNTIL HEALED Patient not taking: Reported on 11/30/2023 10/06/21   Moye, Virginia , MD  omeprazole (PRILOSEC) 40 MG capsule Take by mouth. 11/09/23 11/08/24  [provider]  ondansetron  (ZOFRAN -ODT) 4 MG disintegrating tablet Take 1 tablet (4 mg total) by mouth every 8 (eight) hours as needed for nausea or vomiting. 04/09/22   Twilla Galea, MD  ondansetron  (ZOFRAN -ODT) 4 MG disintegrating tablet Take 1 tablet (4 mg total) by mouth every 6 (six) hours as needed for nausea. Patient not taking: Reported on 11/30/2023 07/15/23   Kris Pester, MD  ondansetron  (ZOFRAN -ODT) 4 MG disintegrating tablet Take 1 tablet (4 mg total) by mouth every 8 (eight) hours as needed for nausea Patient not taking: Reported on 11/30/2023 09/14/23     pantoprazole  (PROTONIX ) 40 MG tablet Take by mouth. Patient not taking:  Reported on 11/30/2023 07/29/21   [provider]  predniSONE  (DELTASONE ) 10 MG tablet Take 10 mg by mouth daily with breakfast. Patient not taking: Reported on 11/30/2023    [provider]  predniSONE  (DELTASONE ) 10 MG tablet Take 4 tablets (40 mg total) by mouth once daily for 3 days, THEN 3 tablets (30 mg total) once daily for 3 days, THEN 2 tablets (20 mg total) once daily for 3 days, THEN 1 tablet (10 mg total) once daily for 3 days. Patient not taking: Reported on 11/30/2023 05/04/23     sertraline (ZOLOFT) 25 MG tablet Take 25 mg by mouth daily. 50 mg    [provider]  topiramate (TOPAMAX) 50 MG tablet Take 1 tablet by mouth at bedtime. Patient not taking: Reported on 11/30/2023 11/16/23 11/15/24  [provider]  venlafaxine XR (EFFEXOR-XR) 37.5 MG 24 hr capsule Take by mouth. 06/18/22 06/18/23  [provider]  vitamin C (ASCORBIC ACID) 500 MG tablet Take 500 mg by mouth daily. Patient not taking: Reported on 11/30/2023    [provider]  VITAMIN D  PO Take 1 tablet by mouth daily. Patient not taking: Reported on  11/30/2023    [provider]  Vitamin D , Ergocalciferol , (DRISDOL ) 1.25 MG (50000 UNIT) CAPS capsule Take 1 capsule (50,000 Units total) by mouth once a week. 08/23/23       Physical Exam   Triage Vital Signs: ED Triage Vitals  Encounter Vitals Group     BP 01/13/24 0556 105/72     Systolic BP Percentile --      Diastolic BP Percentile --      Pulse Rate 01/13/24 0556 88     Resp 01/13/24 0556 18     Temp 01/13/24 0557 98.5 F (36.9 C)     Temp Source 01/13/24 0557 Oral     SpO2 01/13/24 0556 100 %     Weight 01/13/24 0554 130 lb (59 kg)     Height 01/13/24 0554 5' (1.524 m)     Head Circumference --      Peak Flow --      Pain Score 01/13/24 0553 4     Pain Loc --      Pain Education --      Exclude from Growth Chart --     Most recent vital signs: Vitals:   01/13/24 0556 01/13/24 0557  BP: 105/72   Pulse: 88   Resp: 18   Temp:  98.5 F (36.9 C)  SpO2: 100%     CONSTITUTIONAL: Alert, responds appropriately to questions. Well-appearing; well-nourished HEAD: Normocephalic, atraumatic EYES: Conjunctivae clear, pupils appear equal, sclera nonicteric ENT: normal nose; moist mucous membranes NECK: Supple, normal ROM CARD: RRR; S1 and S2 appreciated RESP: Normal chest excursion without splinting or tachypnea; breath sounds clear and equal bilaterally; no wheezes, no rhonchi, no rales, no hypoxia or respiratory distress, speaking full sentences ABD/GI: Non-distended; soft, tender at McBurney's point, no guarding or rebound BACK: The back appears normal EXT: Normal ROM in all joints; no deformity noted, no edema SKIN: Normal color for age and race; warm; no rash on exposed skin NEURO: Moves all extremities equally, normal speech PSYCH: The patient's mood and manner are appropriate.   ED Results / Procedures / Treatments   LABS: (all labs ordered are listed, but only abnormal results are displayed) Labs Reviewed  COMPREHENSIVE METABOLIC PANEL WITH GFR -  Abnormal; Notable for the following components:      Result Value  CO2 17 (*)    Glucose, Bld 126 (*)    All other components within normal limits  LIPASE, BLOOD  CBC  URINALYSIS, ROUTINE W REFLEX MICROSCOPIC  HCG, QUANTITATIVE, PREGNANCY  MAGNESIUM     EKG:   RADIOLOGY: My personal review and interpretation of imaging: CT pending.  I have personally reviewed all radiology reports.   No results found.   PROCEDURES:  Critical Care performed: No      Procedures    IMPRESSION / MDM / ASSESSMENT AND PLAN / ED COURSE  I reviewed the triage vital signs and the nursing notes.    Patient here with vomiting, diarrhea, right lower quadrant pain.     DIFFERENTIAL DIAGNOSIS (includes but not limited to):   Appendicitis, viral gastroenteritis, UTI, pyelonephritis, kidney stone, ovarian cyst, ovarian torsion, PID, dehydration, electrolyte derangement   Patient's presentation is most consistent with acute presentation with potential threat to life or bodily function.   PLAN: Will obtain labs, urine, CT of the pelvis.  Will give IV fluids, pain and nausea medicine.   MEDICATIONS GIVEN IN ED: Medications  sodium chloride  0.9 % bolus 1,000 mL (has no administration in time range)  morphine  (PF) 4 MG/ML injection 4 mg (has no administration in time range)  ondansetron  (ZOFRAN ) injection 4 mg (has no administration in time range)  sodium chloride  0.9 % bolus 1,000 mL (has no administration in time range)     ED COURSE: No leukocytosis.  Bicarb of 17 but normal anion gap, likely from GI loss.  Patient getting IV hydration.  Normal potassium, calcium.  Magnesium level pending.  Urine, hCG, CT of the abdomen pelvis pending.  Signed out to oncoming ED physician.   CONSULTS: Pending further workup.   OUTSIDE RECORDS REVIEWED: Reviewed last rheumatology note on 12/29/2023.       FINAL CLINICAL IMPRESSION(S) / ED DIAGNOSES   Final diagnoses:  Nausea vomiting and  diarrhea     Rx / DC Orders   ED Discharge Orders     None        Note:  This document was prepared using Dragon voice recognition software and may include unintentional dictation errors.   Walsie Smeltz, Clover Dao, DO 01/13/24 [phone]

## 2024-01-13 NOTE — ED Provider Notes (Signed)
 Procedures     ----------------------------------------- 9:42 AM on 01/13/2024 -----------------------------------------  CT negative.  Feeling better, tolerating oral intake.  Stable for discharge    Jacquie Maudlin, MD 01/13/24 [phone]

## 2024-01-13 NOTE — ED Notes (Signed)
 Pt given an additional warm blanket for comfort, pt states that she is feeling better that the medicine has helped, pt oriented to the call bell and instructed to call if she needed anything

## 2024-01-13 NOTE — ED Triage Notes (Signed)
 EMS brings pt in from home for c/o N/V/D and abd cramping since last night; zofran  taken without relief

## 2024-01-14 ENCOUNTER — Other Ambulatory Visit: Payer: Self-pay

## 2024-01-14 MED ORDER — PROMETHAZINE HCL 25 MG PO TABS
25.0000 mg | ORAL_TABLET | Freq: Four times a day (QID) | ORAL | 0 refills | Status: DC | PRN
  Filled 2024-01-14: qty 30, 8d supply, fill #0

## 2024-02-11 ENCOUNTER — Other Ambulatory Visit: Payer: Self-pay

## 2024-02-28 ENCOUNTER — Other Ambulatory Visit: Payer: Self-pay

## 2024-02-28 MED ORDER — FLUCONAZOLE 150 MG PO TABS
150.0000 mg | ORAL_TABLET | Freq: Every day | ORAL | 1 refills | Status: AC
  Filled 2024-02-28: qty 2, 2d supply, fill #0

## 2024-03-03 ENCOUNTER — Encounter: Payer: Self-pay | Admitting: Oncology

## 2024-03-12 ENCOUNTER — Encounter: Payer: Self-pay | Admitting: Dermatology

## 2024-03-13 ENCOUNTER — Other Ambulatory Visit: Payer: Self-pay

## 2024-03-20 ENCOUNTER — Encounter: Payer: Self-pay | Admitting: Dermatology

## 2024-03-20 ENCOUNTER — Ambulatory Visit (INDEPENDENT_AMBULATORY_CARE_PROVIDER_SITE_OTHER): Admitting: Dermatology

## 2024-03-20 DIAGNOSIS — L858 Other specified epidermal thickening: Secondary | ICD-10-CM

## 2024-03-20 DIAGNOSIS — D492 Neoplasm of unspecified behavior of bone, soft tissue, and skin: Secondary | ICD-10-CM

## 2024-03-20 NOTE — Patient Instructions (Addendum)

## 2024-03-20 NOTE — Progress Notes (Signed)
   Follow-Up Visit   Subjective  Diana Ramos is a 38 y.o. female who presents for the following: patient would like place looked at under R breast, has been bothering her for a while on & off, itchy, place has bled before, scaby at times. Constantly at a scab now.   The patient has spots, moles and lesions to be evaluated, some may be new or changing and the patient may have concern these could be cancer.   The following portions of the chart were reviewed this encounter and updated as appropriate: medications, allergies, medical history  Review of Systems:  No other skin or systemic complaints except as noted in HPI or Assessment and Plan.  Objective  Well appearing patient in no apparent distress; mood and affect are within normal limits.  A focused examination was performed of the following areas: R Chest  Relevant exam findings are noted in the Assessment and Plan.  Right Inframammary 4mm pink scaly papule    Assessment & Plan   NEOPLASM OF SKIN Right Inframammary Epidermal / dermal shaving  Lesion diameter (cm):  0.4 Informed consent: discussed and consent obtained   Timeout: patient name, date of birth, surgical site, and procedure verified   Procedure prep:  Patient was prepped and draped in usual sterile fashion Prep type:  Isopropyl alcohol Anesthesia: the lesion was anesthetized in a standard fashion   Anesthetic:  1% lidocaine  w/ epinephrine  1-100,000 buffered w/ 8.4% NaHCO3 Instrument used: DermaBlade   Hemostasis achieved with: pressure and aluminum chloride   Outcome: patient tolerated procedure well   Post-procedure details: wound care instructions given   Specimen 1 - Surgical pathology Differential Diagnosis: irritated nevus vs ISK  Check Margins: No 4mm pink scaly papule Discussed observation or biopsy, patient prefers to biopsy today.   Return for As scheduled, w/ Dr. Claudene.  I, Jacquelynn V. Wilfred, CMA, am acting as scribe for Boneta Claudene, MD .   Documentation: I have reviewed the above documentation for accuracy and completeness, and I agree with the above.  Boneta Claudene, MD

## 2024-03-21 ENCOUNTER — Ambulatory Visit: Payer: Self-pay | Admitting: Dermatology

## 2024-03-21 ENCOUNTER — Encounter: Payer: Self-pay | Admitting: Dermatology

## 2024-03-21 LAB — SURGICAL PATHOLOGY

## 2024-03-28 ENCOUNTER — Other Ambulatory Visit: Payer: Self-pay

## 2024-03-28 MED ORDER — FOLIC ACID 1 MG PO TABS
1.0000 mg | ORAL_TABLET | Freq: Every day | ORAL | 3 refills | Status: DC
  Filled 2024-03-28: qty 90, 90d supply, fill #0

## 2024-03-28 MED ORDER — METHOTREXATE SODIUM 2.5 MG PO TABS
10.0000 mg | ORAL_TABLET | ORAL | 2 refills | Status: DC
  Filled 2024-03-28: qty 16, 28d supply, fill #0

## 2024-03-31 ENCOUNTER — Encounter: Payer: Self-pay | Admitting: Dermatology

## 2024-04-03 ENCOUNTER — Other Ambulatory Visit: Payer: Self-pay | Admitting: Dermatology

## 2024-04-03 ENCOUNTER — Other Ambulatory Visit: Payer: Self-pay

## 2024-04-03 MED ORDER — PREDNISONE 10 MG PO TABS
ORAL_TABLET | ORAL | 0 refills | Status: AC
  Filled 2024-04-03: qty 20, 8d supply, fill #0

## 2024-04-03 MED ORDER — TRETINOIN 0.025 % EX CREA
TOPICAL_CREAM | Freq: Every day | CUTANEOUS | 5 refills | Status: DC
  Filled 2024-04-03: qty 20, 30d supply, fill #0

## 2024-04-04 ENCOUNTER — Other Ambulatory Visit: Payer: Self-pay

## 2024-04-11 ENCOUNTER — Other Ambulatory Visit: Payer: Self-pay

## 2024-04-11 MED ORDER — CLONAZEPAM 0.5 MG PO TABS
0.5000 mg | ORAL_TABLET | Freq: Every evening | ORAL | 4 refills | Status: DC | PRN
  Filled 2024-04-11: qty 30, 30d supply, fill #0
  Filled 2024-05-14: qty 30, 30d supply, fill #1
  Filled 2024-06-15: qty 30, 30d supply, fill #2

## 2024-05-09 ENCOUNTER — Other Ambulatory Visit: Payer: Self-pay

## 2024-05-15 ENCOUNTER — Other Ambulatory Visit: Payer: Self-pay

## 2024-05-31 ENCOUNTER — Other Ambulatory Visit: Payer: Self-pay

## 2024-05-31 MED ORDER — HYDROXYCHLOROQUINE SULFATE 200 MG PO TABS
200.0000 mg | ORAL_TABLET | Freq: Every day | ORAL | 0 refills | Status: DC
  Filled 2024-05-31: qty 30, 30d supply, fill #0

## 2024-06-06 ENCOUNTER — Ambulatory Visit: Payer: Managed Care, Other (non HMO) | Admitting: Dermatology

## 2024-06-14 ENCOUNTER — Other Ambulatory Visit: Payer: Self-pay

## 2024-06-14 ENCOUNTER — Emergency Department

## 2024-06-14 ENCOUNTER — Emergency Department
Admission: EM | Admit: 2024-06-14 | Discharge: 2024-06-14 | Disposition: A | Discharge status: Home / Self Care | Attending: Emergency Medicine | Admitting: Emergency Medicine

## 2024-06-14 DIAGNOSIS — R112 Nausea with vomiting, unspecified: Secondary | ICD-10-CM | POA: Insufficient documentation

## 2024-06-14 DIAGNOSIS — E860 Dehydration: Secondary | ICD-10-CM | POA: Diagnosis not present

## 2024-06-14 DIAGNOSIS — R1084 Generalized abdominal pain: Secondary | ICD-10-CM | POA: Diagnosis present

## 2024-06-14 LAB — CBC
HCT: 40.3 % (ref 36.0–46.0)
Hemoglobin: 13.2 g/dL (ref 12.0–15.0)
MCH: 29.6 pg (ref 26.0–34.0)
MCHC: 32.8 g/dL (ref 30.0–36.0)
MCV: 90.4 fL (ref 80.0–100.0)
Platelets: 263 K/uL (ref 150–400)
RBC: 4.46 MIL/uL (ref 3.87–5.11)
RDW: 13.2 % (ref 11.5–15.5)
WBC: 9.6 K/uL (ref 4.0–10.5)
nRBC: 0 % (ref 0.0–0.2)

## 2024-06-14 LAB — BASIC METABOLIC PANEL WITH GFR
Anion gap: 12 (ref 5–15)
BUN: 12 mg/dL (ref 6–20)
CO2: 20 mmol/L — ABNORMAL LOW (ref 22–32)
Calcium: 9.2 mg/dL (ref 8.9–10.3)
Chloride: 106 mmol/L (ref 98–111)
Creatinine, Ser: 0.89 mg/dL (ref 0.44–1.00)
GFR, Estimated: >60 mL/min (ref >60)
Glucose, Bld: 140 mg/dL — ABNORMAL HIGH (ref 70–99)
Potassium: 4.4 mmol/L (ref 3.5–5.1)
Sodium: 138 mmol/L (ref 135–145)

## 2024-06-14 LAB — TROPONIN I (HIGH SENSITIVITY): Troponin I (High Sensitivity): <2 ng/L (ref <18)

## 2024-06-14 LAB — HEPATIC FUNCTION PANEL
ALT: 9 U/L (ref 0–44)
AST: 27 U/L (ref 15–41)
Albumin: 4 g/dL (ref 3.5–5.0)
Alkaline Phosphatase: 38 U/L (ref 38–126)
Bilirubin, Direct: 0.4 mg/dL — ABNORMAL HIGH (ref 0.0–0.2)
Indirect Bilirubin: 0.5 mg/dL (ref 0.3–0.9)
Total Bilirubin: 0.9 mg/dL (ref 0.0–1.2)
Total Protein: 7.1 g/dL (ref 6.5–8.1)

## 2024-06-14 LAB — HCG, QUANTITATIVE, PREGNANCY: hCG, Beta Chain, Quant, S: <1 m[IU]/mL (ref <5)

## 2024-06-14 LAB — CBG MONITORING, ED: Glucose-Capillary: 142 mg/dL — ABNORMAL HIGH (ref 70–99)

## 2024-06-14 LAB — LIPASE, BLOOD: Lipase: 19 U/L (ref 11–51)

## 2024-06-14 LAB — MAGNESIUM: Magnesium: 2 mg/dL (ref 1.7–2.4)

## 2024-06-14 MED ORDER — IOHEXOL 300 MG/ML  SOLN
100.0000 mL | Freq: Once | INTRAMUSCULAR | Status: AC | PRN
  Administered 2024-06-14: 100 mL via INTRAVENOUS

## 2024-06-14 MED ORDER — PANTOPRAZOLE SODIUM 40 MG IV SOLR
40.0000 mg | Freq: Once | INTRAVENOUS | Status: AC
Start: 2024-06-14 — End: 2024-06-14
  Administered 2024-06-14: 40 mg via INTRAVENOUS
  Filled 2024-06-14: qty 10

## 2024-06-14 MED ORDER — METOCLOPRAMIDE HCL 5 MG/ML IJ SOLN
10.0000 mg | INTRAMUSCULAR | Status: AC
  Administered 2024-06-14: 10 mg via INTRAVENOUS
  Filled 2024-06-14: qty 2

## 2024-06-14 MED ORDER — KETOROLAC TROMETHAMINE 15 MG/ML IJ SOLN
15.0000 mg | Freq: Once | INTRAMUSCULAR | Status: AC
Start: 2024-06-14 — End: 2024-06-14
  Administered 2024-06-14: 15 mg via INTRAVENOUS
  Filled 2024-06-14: qty 1

## 2024-06-14 MED ORDER — ONDANSETRON HCL 4 MG/2ML IJ SOLN: 4.0000 mg | Freq: Once | INTRAMUSCULAR | Status: AC

## 2024-06-14 MED ORDER — SODIUM CHLORIDE 0.9 % IV BOLUS
1000.0000 mL | Freq: Once | INTRAVENOUS | Status: AC
  Administered 2024-06-14: 1000 mL via INTRAVENOUS

## 2024-06-14 MED ORDER — SODIUM CHLORIDE 0.9 % IV SOLN
12.5000 mg | Freq: Four times a day (QID) | INTRAVENOUS | Status: DC | PRN
  Administered 2024-06-14: 12.5 mg via INTRAVENOUS
  Filled 2024-06-14: qty 0.5

## 2024-06-14 MED ORDER — HALOPERIDOL LACTATE 5 MG/ML IJ SOLN
3.0000 mg | Freq: Once | INTRAMUSCULAR | Status: AC
Start: 2024-06-14 — End: 2024-06-14
  Administered 2024-06-14: 3 mg via INTRAVENOUS
  Filled 2024-06-14: qty 1

## 2024-06-14 MED ORDER — ONDANSETRON HCL 4 MG/2ML IJ SOLN
INTRAMUSCULAR | Status: AC
  Administered 2024-06-14: 4 mg via INTRAVENOUS
  Filled 2024-06-14: qty 2

## 2024-06-14 MED ORDER — PROMETHAZINE HCL 25 MG PO TABS: 25.0000 mg | ORAL_TABLET | Freq: Four times a day (QID) | ORAL | 0 refills | Status: AC | PRN

## 2024-06-14 NOTE — ED Provider Notes (Signed)
-----------------------------------------   4:50 PM on 06/14/2024 -----------------------------------------  I took over care of this patient from Dr. Viviann.  On reassessment the patient states that she has been able to take small sips of liquid.  She is still not tolerating much p.o. and has had some additional episodes of vomiting, however she states that she is feeling somewhat better.  She has a strong desire to be discharged home.  Advised her that typically with refractory nausea and vomiting we will consider inpatient admission although since her lab workup and imaging are reassuring and she is tolerating a small amount of p.o., I think it is reasonable to discharge her if that is her preference.  She expressed understanding and agreement.  I gave her strict return precautions, and she expressed understanding of these as well.   Jacolyn Pae, MD 06/14/24 1651

## 2024-06-14 NOTE — ED Notes (Signed)
 Attempted to PO challenge Pt, Pt vomited again after reglan  administration. Waited 15 mins later and asked Pt to retry attempting crackers and water. Pt stated she was unable to consume any foods at this time. MD notified

## 2024-06-14 NOTE — Discharge Instructions (Addendum)
 Take the Phenergan  as needed for nausea.  Return to the ER immediately for new, worsening, or persistent severe nausea or vomiting, abdominal pain, inability hold anything down, weakness or lightheadedness, or any other new or worsening symptoms that concern you.

## 2024-06-14 NOTE — ED Provider Notes (Signed)
 [facility] Provider Note    Event Date/Time   First MD Initiated Contact with Patient 06/14/24 [phone]     (approximate)   History   Chief Complaint: Emesis and Chest Pain   HPI  Diana Ramos is a 38 y.o. female with a history of lupus and anxiety who comes ED complaining of vomiting and upper abdominal pain that started around 6:00 AM today.  Patient had reported that she was feeling unwell last night, went to bed early, and this morning was having emesis.  No known sick contacts or suspect food.  No shortness of breath or fever but does feel fatigued and cold.        Past Medical History:  Diagnosis Date   Anemia    Anxiety    Headache    Lupus    Menorrhagia with irregular cycle    PONV (postoperative nausea and vomiting)    slow to wake up   Ulcerative colitis (HCC)    Vitamin B12 deficiency    Vitamin D  deficiency     Current Outpatient Rx   Order #: [id number] Class: Normal   Order #: [id number] Class: Historical Med   Order #: [id number] Class: Historical Med   Order #: [id number] Class: Normal   Order #: [id number] Class: Normal   Order #: [id number] Class: Normal   Order #: [id number] Class: Historical Med   Order #: [id number] Class: Normal   Order #: [id number] Class: Historical Med   Order #: [id number] Class: Historical Med   Order #: [id number] Class: Normal   Order #: [id number] Class: Normal   Order #: [id number] Class: Normal   Order #: [id number] Class: Historical Med   Order #: [id number] Class: Normal   Order #: [id number] Class: Historical Med   Order #: [id number] Class: Normal   Order #: [id number] Class: Historical Med   Order #: [id number] Class: Normal   Order #: [id number] Class: Normal   Order #: [id number] Class: Historical Med   Order #: [id number] Class: Normal   Order #: [id number] Class: Historical Med   Order #: [id number] Class: Normal    Past Surgical History:  Procedure Laterality Date   BREAST BIOPSY Left 05/30/2020   us  bx,  papilloma, vision marker   BREAST BIOPSY WITH RADIO FREQUENCY LOCALIZER Left 06/21/2020   Procedure: BREAST BIOPSY WITH RADIO FREQUENCY LOCALIZER;  Surgeon: Tye Millet, DO;  Location: [facility] ORS;  Service: General;  Laterality: Left;   BREAST EXCISIONAL BIOPSY Left 06/21/2020   papilloma, negative for atypia and malignancy   BREAST LUMPECTOMY Left    10/ 2021   CESAREAN SECTION     x3   DILITATION & CURRETTAGE/HYSTROSCOPY WITH NOVASURE ABLATION N/A 07/15/2023   Procedure: DILATATION & CURETTAGE/HYSTEROSCOPY WITH NOVASURE ABLATION;  Surgeon: Leonce Garnette BIRCH, MD;  Location: [facility] ORS;  Service: Gynecology;  Laterality: N/A;   TONSILLECTOMY     TONSILLECTOMY AND ADENOIDECTOMY     TUBAL LIGATION Bilateral     Physical Exam   Triage Vital Signs: ED Triage Vitals  Encounter Vitals Group     BP 06/14/24 0919 114/71     Girls Systolic BP Percentile --      Girls Diastolic BP Percentile --      Boys Systolic BP Percentile --      Boys Diastolic BP Percentile --      Pulse Rate 06/14/24 0919 66     Resp 06/14/24 0919 20     Temp --      Temp src --      SpO2 06/14/24 0919 100 %     Weight 06/14/24 0917  140 lb (63.5 kg)     Height 06/14/24 0917 5' 3 (1.6 m)     Head Circumference --      Peak Flow --      Pain Score 06/14/24 0917 5     Pain Loc --      Pain Education --      Exclude from Growth Chart --     Most recent vital signs: Vitals:   06/14/24 1229 06/14/24 1230  BP:  122/72  Pulse:  65  Resp:  (!) 23  Temp: 97.6 F (36.4 C)   SpO2:  100%    General: Awake, no distress.  CV:  Good peripheral perfusion.  Regular rate and rhythm, normal distal pulses Resp:  Normal effort.  Clear lungs Abd:  No distention.  Soft with diffuse upper abdominal tenderness Other:  Dry oral mucosa   ED Results / Procedures / Treatments   Labs (all labs ordered are listed, but only abnormal results are displayed) Labs Reviewed  BASIC METABOLIC PANEL WITH GFR - Abnormal; Notable  for the following components:      Result Value   CO2 20 (*)    Glucose, Bld 140 (*)    All other components within normal limits  HEPATIC FUNCTION PANEL - Abnormal; Notable for the following components:   Bilirubin, Direct 0.4 (*)    All other components within normal limits  CBG MONITORING, ED - Abnormal; Notable for the following components:   Glucose-Capillary 142 (*)    All other components within normal limits  CBC  LIPASE, BLOOD  MAGNESIUM  HCG, QUANTITATIVE, PREGNANCY  POC URINE PREG, ED  TROPONIN I (HIGH SENSITIVITY)     EKG Interpreted by me Sinus bradycardia rate of 48.  Normal axis intervals QRS ST segments T waves   RADIOLOGY Chest x-ray interpreted by me, unremarkable.  Radiology report reviewed.  CT abdomen pelvis unremarkable   PROCEDURES:  Procedures   MEDICATIONS ORDERED IN ED: Medications  promethazine  (PHENERGAN ) 12.5 mg in sodium chloride  0.9 % 50 mL IVPB (0 mg Intravenous Stopped 06/14/24 1130)  ondansetron  (ZOFRAN ) injection 4 mg (4 mg Intravenous Given 06/14/24 0934)  sodium chloride  0.9 % bolus 1,000 mL (0 mLs Intravenous Stopped 06/14/24 1354)  ketorolac  (TORADOL ) 15 MG/ML injection 15 mg (15 mg Intravenous Given 06/14/24 1003)  pantoprazole  (PROTONIX ) injection 40 mg (40 mg Intravenous Given 06/14/24 1003)  haloperidol  lactate (HALDOL ) injection 3 mg (3 mg Intravenous Given 06/14/24 1309)  iohexol  (OMNIPAQUE ) 300 MG/ML solution 100 mL (100 mLs Intravenous Contrast Given 06/14/24 1255)  metoCLOPramide  (REGLAN ) injection 10 mg (10 mg Intravenous Given 06/14/24 1456)     IMPRESSION / MDM / ASSESSMENT AND PLAN / ED COURSE  I reviewed the triage vital signs and the nursing notes.  DDx: Dehydration, AKI, electrolyte derangement, viral gastritis, pregnancy.  Doubt ACS PE dissection  Patient's presentation is most consistent with acute presentation with potential threat to life or bodily function.  Patient presents with upper abdominal pain, nausea  and vomiting.  Vital signs unremarkable, suspect viral syndrome.  Will give IV fluids, antiemetics.   Clinical Course as of 06/14/24 1556  Wed Jun 14, 2024  1433 Still nauseated, not able to try PO. Will give reglan , recheck EKG intervals, PO trial, dispo [PS]    Clinical Course User Index [PS] Viviann Pastor, MD     FINAL CLINICAL IMPRESSION(S) / ED DIAGNOSES   Final diagnoses:  Generalized abdominal pain  Nausea and vomiting, unspecified vomiting type  Dehydration  Rx / DC Orders   ED Discharge Orders     None        Note:  This document was prepared using Dragon voice recognition software and may include unintentional dictation errors.   Viviann Pastor, MD 06/14/24 [phone]

## 2024-06-14 NOTE — ED Triage Notes (Signed)
 Pt to ED with husband for vomiting and chest pain since 6am today. EMS came to house and checked her out and left. Pt crying and shaking her legs.

## 2024-06-16 ENCOUNTER — Other Ambulatory Visit: Payer: Self-pay

## 2024-06-18 ENCOUNTER — Other Ambulatory Visit: Payer: Self-pay

## 2024-06-19 ENCOUNTER — Other Ambulatory Visit: Payer: Self-pay

## 2024-06-19 MED ORDER — ALPRAZOLAM 0.5 MG PO TABS
0.5000 mg | ORAL_TABLET | Freq: Every evening | ORAL | 1 refills | Status: DC | PRN
  Filled 2024-06-19: qty 30, 30d supply, fill #0

## 2024-06-19 MED ORDER — SERTRALINE HCL 100 MG PO TABS
100.0000 mg | ORAL_TABLET | Freq: Every day | ORAL | 1 refills | Status: DC
  Filled 2024-06-19: qty 30, 30d supply, fill #0

## 2024-06-21 ENCOUNTER — Other Ambulatory Visit: Payer: Self-pay

## 2024-06-22 ENCOUNTER — Other Ambulatory Visit: Payer: Self-pay

## 2024-06-23 ENCOUNTER — Other Ambulatory Visit: Payer: Self-pay

## 2024-06-26 ENCOUNTER — Other Ambulatory Visit: Payer: Self-pay

## 2024-06-28 ENCOUNTER — Other Ambulatory Visit: Payer: Self-pay

## 2024-06-28 MED ORDER — BACLOFEN 10 MG PO TABS
10.0000 mg | ORAL_TABLET | Freq: Two times a day (BID) | ORAL | 3 refills | Status: AC | PRN
  Filled 2024-06-28: qty 60, 30d supply, fill #0
  Filled 2024-09-16: qty 60, 30d supply, fill #1

## 2024-06-29 ENCOUNTER — Other Ambulatory Visit: Payer: Self-pay

## 2024-06-29 MED ORDER — CETIRIZINE HCL 10 MG PO TABS
10.0000 mg | ORAL_TABLET | Freq: Every day | ORAL | 1 refills | Status: AC
  Filled 2024-06-29: qty 90, 90d supply, fill #0
  Filled 2024-09-16: qty 90, 90d supply, fill #1

## 2024-07-05 ENCOUNTER — Other Ambulatory Visit: Payer: Self-pay

## 2024-07-05 MED ORDER — CLONAZEPAM 0.5 MG PO TABS
0.5000 mg | ORAL_TABLET | Freq: Two times a day (BID) | ORAL | 0 refills | Status: DC | PRN
  Filled 2024-07-05: qty 60, 30d supply, fill #0

## 2024-07-26 ENCOUNTER — Other Ambulatory Visit: Payer: Self-pay

## 2024-07-26 MED ORDER — SERTRALINE HCL 50 MG PO TABS
75.0000 mg | ORAL_TABLET | Freq: Every day | ORAL | 1 refills | Status: AC
  Filled 2024-07-26: qty 135, 90d supply, fill #0
  Filled 2024-10-23: qty 135, 90d supply, fill #1

## 2024-07-27 ENCOUNTER — Other Ambulatory Visit: Payer: Self-pay

## 2024-07-27 ENCOUNTER — Encounter: Payer: Self-pay | Admitting: Dermatology

## 2024-07-27 ENCOUNTER — Ambulatory Visit (INDEPENDENT_AMBULATORY_CARE_PROVIDER_SITE_OTHER): Admitting: Dermatology

## 2024-07-27 DIAGNOSIS — L578 Other skin changes due to chronic exposure to nonionizing radiation: Secondary | ICD-10-CM

## 2024-07-27 DIAGNOSIS — Z1283 Encounter for screening for malignant neoplasm of skin: Secondary | ICD-10-CM

## 2024-07-27 DIAGNOSIS — D1801 Hemangioma of skin and subcutaneous tissue: Secondary | ICD-10-CM

## 2024-07-27 DIAGNOSIS — L814 Other melanin hyperpigmentation: Secondary | ICD-10-CM

## 2024-07-27 DIAGNOSIS — L821 Other seborrheic keratosis: Secondary | ICD-10-CM

## 2024-07-27 DIAGNOSIS — W908XXA Exposure to other nonionizing radiation, initial encounter: Secondary | ICD-10-CM

## 2024-07-27 DIAGNOSIS — L219 Seborrheic dermatitis, unspecified: Secondary | ICD-10-CM

## 2024-07-27 DIAGNOSIS — D229 Melanocytic nevi, unspecified: Secondary | ICD-10-CM

## 2024-07-27 DIAGNOSIS — M329 Systemic lupus erythematosus, unspecified: Secondary | ICD-10-CM

## 2024-07-27 DIAGNOSIS — Z808 Family history of malignant neoplasm of other organs or systems: Secondary | ICD-10-CM

## 2024-07-27 MED ORDER — IBUPROFEN 600 MG PO TABS
600.0000 mg | ORAL_TABLET | Freq: Three times a day (TID) | ORAL | 3 refills | Status: AC | PRN
Start: 2024-07-27 — End: ?
  Filled 2024-07-27: qty 30, 10d supply, fill #0

## 2024-07-27 NOTE — Patient Instructions (Addendum)
 Recommend OTC Head & Shoulders shampoo 2-3x per week, massage into scalp and let sit 3-5 minutes before rinsing.  Melanoma ABCDEs  Melanoma is the most dangerous type of skin cancer, and is the leading cause of death from skin disease.  You are more likely to develop melanoma if you: Have light-colored skin, light-colored eyes, or red or blond hair Spend a lot of time in the sun Tan regularly, either outdoors or in a tanning bed Have had blistering sunburns, especially during childhood Have a close family member who has had a melanoma Have atypical moles or large birthmarks  Early detection of melanoma is key since treatment is typically straightforward and cure rates are extremely high if we catch it early.   The first sign of melanoma is often a change in a mole or a new dark spot.  The ABCDE system is a way of remembering the signs of melanoma.  A for asymmetry:  The two halves do not match. B for border:  The edges of the growth are irregular. C for color:  A mixture of colors are present instead of an even brown color. D for diameter:  Melanomas are usually (but not always) greater than 6mm - the size of a pencil eraser. E for evolution:  The spot keeps changing in size, shape, and color.  Please check your skin once per month between visits. You can use a small mirror in front and a large mirror behind you to keep an eye on the back side or your body.   If you see any new or changing lesions before your next follow-up, please call to schedule a visit.  Please continue daily skin protection including broad spectrum sunscreen SPF 30+ to sun-exposed areas, reapplying every 2 hours as needed when you're outdoors.    Due to recent changes in healthcare laws, you may see results of your pathology and/or laboratory studies on MyChart before the doctors have had a chance to review them. We understand that in some cases there may be results that are confusing or concerning to you. Please  understand that not all results are received at the same time and often the doctors may need to interpret multiple results in order to provide you with the best plan of care or course of treatment. Therefore, we ask that you please give us  2 business days to thoroughly review all your results before contacting the office for clarification. Should we see a critical lab result, you will be contacted sooner.   If You Need Anything After Your Visit  If you have any questions or concerns for your doctor, please call our main line at [phone] and press option 4 to reach your doctor's medical assistant. If no one answers, please leave a voicemail as directed and we will return your call as soon as possible. Messages left after 4 pm will be answered the following business day.   You may also send us  a message via MyChart. We typically respond to MyChart messages within 1-2 business days.  For prescription refills, please ask your pharmacy to contact our office. Our fax number is [phone].  If you have an urgent issue when the clinic is closed that cannot wait until the next business day, you can page your doctor at the number below.    Please note that while we do our best to be available for urgent issues outside of office hours, we are not available 24/7.   If you have an urgent issue and  are unable to reach us , you may choose to seek medical care at your doctor's office, retail clinic, urgent care center, or emergency room.  If you have a medical emergency, please immediately call 911 or go to the emergency department.  Pager Numbers  - Dr. Hester: [phone]  - Dr. Jackquline: [phone]  - Dr. Claudene: [phone]   - Dr. Raymund: [phone]  In the event of inclement weather, please call our main line at [phone] for an update on the status of any delays or closures.  Dermatology Medication Tips: Please keep the boxes that topical medications come in in order to help keep  track of the instructions about where and how to use these. Pharmacies typically print the medication instructions only on the boxes and not directly on the medication tubes.   If your medication is too expensive, please contact our office at [phone] option 4 or send us  a message through MyChart.   We are unable to tell what your co-pay for medications will be in advance as this is different depending on your insurance coverage. However, we may be able to find a substitute medication at lower cost or fill out paperwork to get insurance to cover a needed medication.   If a prior authorization is required to get your medication covered by your insurance company, please allow us  1-2 business days to complete this process.  Drug prices often vary depending on where the prescription is filled and some pharmacies may offer cheaper prices.  The website www.goodrx.com contains coupons for medications through different pharmacies. The prices here do not account for what the cost may be with help from insurance (it may be cheaper with your insurance), but the website can give you the price if you did not use any insurance.  - You can print the associated coupon and take it with your prescription to the pharmacy.  - You may also stop by our office during regular business hours and pick up a GoodRx coupon card.  - If you need your prescription sent electronically to a different pharmacy, notify our office through [facility] or by phone at [phone] option 4.     Si Usted Necesita Algo Despus de Su Visita  Tambin puede enviarnos un mensaje a travs de Clinical Cytogeneticist. Por lo general respondemos a los mensajes de MyChart en el transcurso de 1 a 2 das hbiles.  Para renovar recetas, por favor pida a su farmacia que se ponga en contacto con nuestra oficina. Randi lakes de fax es [place] [phone].  Si tiene un asunto urgente cuando la clnica est cerrada y que no puede esperar hasta el  siguiente da hbil, puede llamar/localizar a su doctor(a) al nmero que aparece a continuacin.   Por favor, tenga en cuenta que aunque hacemos todo lo posible para estar disponibles para asuntos urgentes fuera del horario de [place], no estamos disponibles las 24 horas del da, los 7 [address]  [address] de la [place].   Si tiene un problema urgente y no puede comunicarse con nosotros, puede optar por buscar atencin mdica  en el consultorio de su doctor(a), en una clnica privada, en un centro de atencin urgente o en una sala de emergencias.  Si tiene engineer, drilling, por favor llame inmediatamente al 911 o vaya a la sala de emergencias.  Nmeros de bper  - Dr. Hester: [phone]  - Dra. Jackquline: [phone]  - Dr. Claudene: [phone]  - Dra. Kitts: [phone]  En caso de inclemencias del tiempo, por favor llame  a landry capes principal al [phone] para una actualizacin sobre el [place] de cualquier retraso o cierre.  Consejos para la medicacin en dermatologa: Por favor, guarde las cajas en las que vienen los medicamentos de uso tpico para ayudarle a seguir las instrucciones sobre dnde y cmo usarlos. Las farmacias generalmente imprimen las instrucciones del medicamento slo en las cajas y no directamente en los tubos del [place].   Si su medicamento es muy caro, por favor, pngase en contacto con landry rieger llamando al [phone] y presione la opcin 4 o envenos un mensaje a travs de Clinical Cytogeneticist.   No podemos decirle cul ser su copago por los medicamentos por adelantado ya que esto es diferente dependiendo de la cobertura de su seguro. Sin embargo, es posible que podamos encontrar un medicamento sustituto a audiological scientist un formulario para que el seguro cubra el medicamento que se considera necesario.   Si se requiere una autorizacin previa para que su compaa de seguros cubra su medicamento, por favor permtanos de 1 a 2 das hbiles para completar este  proceso.  Los precios de los medicamentos varan con frecuencia dependiendo del environmental consultant de dnde se surte la receta y alguna farmacias pueden ofrecer precios ms baratos.  El sitio web www.goodrx.com tiene cupones para medicamentos de health and safety inspector. Los precios aqu no tienen en cuenta lo que podra costar con la ayuda del seguro (puede ser ms barato con su seguro), pero el sitio web puede darle el precio si no utiliz tourist information centre manager.  - Puede imprimir el cupn correspondiente y llevarlo con su receta a la farmacia.  - Tambin puede pasar por nuestra oficina durante el horario de atencin regular y education officer, museum una tarjeta de cupones de GoodRx.  - Si necesita que su receta se enve electrnicamente a una farmacia diferente, informe a nuestra oficina a travs de MyChart de [place] o por telfono llamando al [phone] y presione la opcin 4.

## 2024-07-27 NOTE — Progress Notes (Signed)
   Follow-Up Visit   Subjective  Diana Ramos is a 38 y.o. female who presents for the following: Skin Cancer Screening and Full Body Skin Exam  The patient presents for Total-Body Skin Exam (TBSE) for skin cancer screening and mole check. The patient has spots, moles and lesions to be evaluated, some may be new or changing and the patient may have concern these could be cancer.  Fhx MM. Patient does c/o itchy scalp.  The following portions of the chart were reviewed this encounter and updated as appropriate: medications, allergies, medical history  Review of Systems:  No other skin or systemic complaints except as noted in HPI or Assessment and Plan.  Objective  Well appearing patient in no apparent distress; mood and affect are within normal limits.  A full examination was performed including scalp, head, eyes, ears, nose, lips, neck, chest, axillae, abdomen, back, buttocks, bilateral upper extremities, bilateral lower extremities, hands, feet, fingers, toes, fingernails, and toenails. All findings within normal limits unless otherwise noted below.   Relevant physical exam findings are noted in the Assessment and Plan.    Assessment & Plan   SKIN CANCER SCREENING PERFORMED TODAY.  ACTINIC DAMAGE - Chronic condition, secondary to cumulative UV/sun exposure - diffuse scaly erythematous macules with underlying dyspigmentation - Recommend daily broad spectrum sunscreen SPF 30+ to sun-exposed areas, reapply every 2 hours as needed.  - Staying in the shade or wearing long sleeves, sun glasses (UVA+UVB protection) and wide brim hats (4-inch brim around the entire circumference of the hat) are also recommended for sun protection.  - Call for new or changing lesions.  LENTIGINES, SEBORRHEIC KERATOSES, HEMANGIOMAS - Benign normal skin lesions - Benign-appearing - Call for any changes  MELANOCYTIC NEVI - Tan-brown and/or pink-flesh-colored symmetric macules and papules - Benign  appearing on exam today - Observation - Call clinic for new or changing moles - Recommend daily use of broad spectrum spf 30+ sunscreen to sun-exposed areas.   Hx of lupus (SLE) -  Continue care with rheumatologist and medications as prescribed (Plaquenil  200mg  po QD, and Benlysta IV).   FAMILY HISTORY OF SKIN CANCER What type(s): Stage 4 melanoma with mets to lungs Who affected: Brother    SEBORRHEIC DERMATITIS Exam: mild scaling at scalp  Chronic and persistent condition with duration or expected duration over one year. Condition is symptomatic / bothersome to patient. Not to goal.  Seborrheic Dermatitis is a chronic persistent rash characterized by pinkness and scaling most commonly of the mid face but also can occur on the scalp (dandruff), ears; mid chest, mid back and groin.  It tends to be exacerbated by stress and cooler weather.  People who have neurologic disease may experience new onset or exacerbation of existing seborrheic dermatitis.  The condition is not curable but treatable and can be controlled.  Treatment Plan: Recommend OTC Head & Shoulders shampoo 2-3x per week, massage into scalp and let sit 3-5 minutes before rinsing.   MULTIPLE BENIGN NEVI   LENTIGINES   ACTINIC ELASTOSIS   CHERRY ANGIOMA   SEBORRHEIC KERATOSES   SEBORRHEIC DERMATITIS   Return in about 1 year (around 07/27/2025) for TBSE, with Dr. Claudene, FhxMM.  LILLETTE Lonell Drones, RMA, am acting as scribe for Boneta Claudene, MD .   Documentation: I have reviewed the above documentation for accuracy and completeness, and I agree with the above.  Boneta Claudene, MD

## 2024-08-08 ENCOUNTER — Encounter: Payer: Self-pay | Admitting: Oncology

## 2024-08-15 ENCOUNTER — Ambulatory Visit (INDEPENDENT_AMBULATORY_CARE_PROVIDER_SITE_OTHER): Admitting: Psychiatry

## 2024-08-15 ENCOUNTER — Encounter: Payer: Self-pay | Admitting: Psychiatry

## 2024-08-15 DIAGNOSIS — F439 Reaction to severe stress, unspecified: Secondary | ICD-10-CM | POA: Diagnosis not present

## 2024-08-15 DIAGNOSIS — F411 Generalized anxiety disorder: Secondary | ICD-10-CM | POA: Diagnosis not present

## 2024-08-15 DIAGNOSIS — F321 Major depressive disorder, single episode, moderate: Secondary | ICD-10-CM | POA: Diagnosis not present

## 2024-08-15 NOTE — Patient Instructions (Addendum)
 [email or web address]  www.[email or web address]  [email or web address] [facility] [address], [place], [place] [address]   [phone]  [facility] [email or web address] [address] [place], [place] [address]  [phone]  [facility] [email or web address] [address], [place], [place] [address]  [phone]   [facility] - [phone]  [facility] - [phone]  [facility] - [phone]   [facility] [phone]  [facility] [phone]     Duloxetine Delayed-Release Capsules What is this medication? DULOXETINE (doo LOX e teen) treats depression, anxiety, fibromyalgia, and certain types of chronic pain such as nerve, bone, or joint pain. It increases the amount of serotonin and norepinephrine in the brain, hormones that help regulate mood and pain. It belongs to a group of medications called SNRIs. This medicine may be used for other purposes; ask your health care provider or pharmacist if you have questions. COMMON BRAND NAME(S): Cymbalta, Drizalma, Irenka What should I tell my care team before I take this medication? They need to know if you have any of these conditions: Bipolar disorder Glaucoma High blood pressure Kidney disease Liver disease Seizures Suicidal thoughts, plans, or attempt by you or a family member Take medications that treat or prevent blood clots Taken an MAOI, such as Carbex, Eldepryl, Marplan, Nardil, or Parnate in the last 14 days Trouble passing urine An unusual reaction to duloxetine, other medications, foods, dyes, or preservatives Pregnant or trying to get pregnant Breastfeeding How should I use this medication? Take this medication by mouth with water. Take it as directed on the prescription label at the same time every day. Do not cut, crush, or chew this medication. Swallow the  capsules whole. Some capsules may be opened and sprinkled on applesauce. Check with your care team or pharmacist if you are not sure. You can take this medication with or without food. Do not take your medication more often than directed. Do not stop taking this medication suddenly except upon the advice of your care team. Stopping this medication too quickly may cause serious side effects or your condition may worsen. A special MedGuide will be given to you by the pharmacist with each prescription and refill. Be sure to read this information carefully each time. Talk to your care team about the use of this medication in children. While it may be prescribed for children as young as 7 years for selected conditions, precautions do apply. Overdosage: If you think you have taken too much of this medicine contact a poison control center or emergency room at once. NOTE: This medicine is only for you. Do not share this medicine with others. What if I miss a dose? If you miss a dose, take it as soon as you can. If it is almost time for your next dose, take only that dose. Do not take double or extra doses. What may interact with this medication? Do not take this medication with any of the following: Desvenlafaxine Levomilnacipran Linezolid MAOIs, such as Carbex, Eldepryl, Emsam, Marplan, Nardil, and Parnate Methylene blue (injected into a vein) Milnacipran Safinamide Thioridazine Venlafaxine Viloxazine This medication may also interact with the following: Alcohol Amphetamines Aspirin and aspirin-like medications Certain antibiotics, such as ciprofloxacin and enoxacin Certain medications for blood pressure, heart disease, irregular heart beat Certain medications for mental health conditions Certain medications for migraine headache, such as almotriptan, eletriptan, frovatriptan, naratriptan, rizatriptan, sumatriptan, zolmitriptan Certain medications that treat  or prevent blood clots, such as  warfarin, enoxaparin, and dalteparin Cimetidine Fentanyl  Lithium NSAIDS, medications for pain and inflammation, such as ibuprofen  or naproxen Phentermine Procarbazine Rasagiline Sibutramine St. John's Wort Theophylline Tramadol  Tryptophan This list may not describe all possible interactions. Give your health care provider a list of all the medicines, herbs, non-prescription drugs, or dietary supplements you use. Also tell them if you smoke, drink alcohol, or use illegal drugs. Some items may interact with your medicine. What should I watch for while using this medication? Tell your care team if your symptoms do not get better or if they get worse. Visit your care team for regular checks on your progress. Because it may take several weeks to see the full effects of this medication, it is important to continue your treatment as prescribed by your care team. This medication may cause serious skin reactions. They can happen weeks to months after starting the medication. Contact your care team right away if you notice fevers or flu-like symptoms with a rash. The rash may be red or purple and then turn into blisters or peeling of the skin. You may also notice a red rash with swelling of the face, lips, or lymph nodes in your neck or under your arms. Watch for new or worsening thoughts of suicide or depression. This includes sudden changes in mood, behaviors, or thoughts. These changes can happen at any time but are more common in the beginning of treatment or after a change in dose. Call your care team right away if you experience these thoughts or worsening depression. This medication may cause mood and behavior changes, such as anxiety, nervousness, irritability, hostility, restlessness, excitability, hyperactivity, or trouble sleeping. These changes can happen at any time but are more common in the beginning of treatment or after a change in dose. Call your care team right away if you notice any of  these symptoms. This medication may affect your coordination, reaction time, or judgment. Do not drive or operate machinery until you know how this medication affects you. Sit up or stand slowly to reduce the risk of dizzy or fainting spells. Drinking alcohol with this medication can increase the risk of these side effects. This medication may increase blood sugar. The risk may be higher in patients who already have diabetes. Ask your care team what you can do to lower your risk of diabetes while taking this medication. This medication can cause an increase in blood pressure. This medication can also cause a sudden drop in your blood pressure, which may make you feel faint and increase the chance of a fall. These effects are most common when you first start the medication or when the dose is increased, or during use of other medications that can cause a sudden drop in blood pressure. Check with your care team for instructions on monitoring your blood pressure while taking this medication. Your mouth may get dry. Chewing sugarless gum or sucking hard candy and drinking plenty of water may help. Contact your care team if the problem does not go away or is severe. What side effects may I notice from receiving this medication? Side effects that you should report to your care team as soon as possible: Allergic reactions--skin rash, itching, hives, swelling of the face, lips, tongue, or throat Bleeding--bloody or black, tar-like stools, red or dark brown urine, vomiting blood or brown material that looks like coffee grounds, small, red or purple spots on skin, unusual bleeding or bruising Increase in blood pressure  Liver injury--right upper belly pain, loss of appetite, nausea, light-colored stool, dark yellow or brown urine, yellowing skin or eyes, unusual weakness or fatigue Low sodium level--muscle weakness, fatigue, dizziness, headache, confusion Redness, blistering, peeling, or loosening of the skin,  including inside the mouth Serotonin syndrome--irritability, confusion, fast or irregular heartbeat, muscle stiffness, twitching muscles, sweating, high fever, seizures, chills, vomiting, diarrhea Sudden eye pain or change in vision such as blurry vision, seeing halos around lights, vision loss Thoughts of suicide or self-harm, worsening mood, feelings of depression Trouble passing urine Side effects that usually do not require medical attention (report to your care team if they continue or are bothersome): Change in sex drive or performance Constipation Diarrhea Dizziness Dry mouth Excessive sweating Loss of appetite Nausea Vomiting This list may not describe all possible side effects. Call your doctor for medical advice about side effects. You may report side effects to FDA at 1-800-FDA-1088. Where should I keep my medication? Keep out of the reach of children and pets. Store at room temperature between 15 and 30 degrees C (59 to 86 degrees F). Get rid of any unused medication after the expiration date. To get rid of medications that are no longer needed or have expired: Take the medication to a medication take-back program. Check with your pharmacy or law enforcement to find a location. If you cannot return the medication, check the label or package insert to see if the medication should be thrown out in the garbage or flushed down the toilet. If you are not sure, ask your care team. If it is safe to put it in the trash, take the medication out of the container. Mix the medication with cat litter, dirt, coffee grounds, or other unwanted substance. Seal the mixture in a bag or container. Put it in the trash. NOTE: This sheet is a summary. It may not cover all possible information. If you have questions about this medicine, talk to your doctor, pharmacist, or health care provider.  2024 Elsevier/Gold Standard (2022-06-11 00:00:00)
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
## 2024-08-16 ENCOUNTER — Encounter: Payer: Self-pay | Admitting: Psychiatry

## 2024-08-28 ENCOUNTER — Other Ambulatory Visit: Payer: Self-pay

## 2024-08-28 MED ORDER — CLONAZEPAM 0.5 MG PO TABS
0.5000 mg | ORAL_TABLET | Freq: Two times a day (BID) | ORAL | 0 refills | Status: AC | PRN
  Filled 2024-08-28: qty 60, 30d supply, fill #0

## 2024-09-13 ENCOUNTER — Other Ambulatory Visit: Payer: Self-pay

## 2024-09-13 ENCOUNTER — Emergency Department: Admission: EM | Admit: 2024-09-13 | Discharge: 2024-09-13 | Disposition: A | Discharge status: Home / Self Care

## 2024-09-13 ENCOUNTER — Encounter: Payer: Self-pay | Admitting: Oncology

## 2024-09-13 DIAGNOSIS — R1031 Right lower quadrant pain: Secondary | ICD-10-CM | POA: Insufficient documentation

## 2024-09-13 DIAGNOSIS — R112 Nausea with vomiting, unspecified: Secondary | ICD-10-CM | POA: Insufficient documentation

## 2024-09-13 DIAGNOSIS — R197 Diarrhea, unspecified: Secondary | ICD-10-CM | POA: Insufficient documentation

## 2024-09-13 DIAGNOSIS — R1032 Left lower quadrant pain: Secondary | ICD-10-CM | POA: Diagnosis not present

## 2024-09-13 LAB — COMPREHENSIVE METABOLIC PANEL WITH GFR
ALT: 9 U/L (ref 0–44)
AST: 22 U/L (ref 15–41)
Albumin: 4.6 g/dL (ref 3.5–5.0)
Alkaline Phosphatase: 70 U/L (ref 38–126)
Anion gap: 20 — ABNORMAL HIGH (ref 5–15)
BUN: 9 mg/dL (ref 6–20)
CO2: 17 mmol/L — ABNORMAL LOW (ref 22–32)
Calcium: 9.4 mg/dL (ref 8.9–10.3)
Chloride: 104 mmol/L (ref 98–111)
Creatinine, Ser: 0.78 mg/dL (ref 0.44–1.00)
GFR, Estimated: >60 mL/min
Glucose, Bld: 109 mg/dL — ABNORMAL HIGH (ref 70–99)
Potassium: 3.8 mmol/L (ref 3.5–5.1)
Sodium: 140 mmol/L (ref 135–145)
Total Bilirubin: 0.3 mg/dL (ref 0.0–1.2)
Total Protein: 7.6 g/dL (ref 6.5–8.1)

## 2024-09-13 LAB — CBC
HCT: 39.5 % (ref 36.0–46.0)
Hemoglobin: 13 g/dL (ref 12.0–15.0)
MCH: 30.2 pg (ref 26.0–34.0)
MCHC: 32.9 g/dL (ref 30.0–36.0)
MCV: 91.9 fL (ref 80.0–100.0)
Platelets: 291 K/uL (ref 150–400)
RBC: 4.3 MIL/uL (ref 3.87–5.11)
RDW: 13.6 % (ref 11.5–15.5)
WBC: 9.1 K/uL (ref 4.0–10.5)
nRBC: 0 % (ref 0.0–0.2)

## 2024-09-13 LAB — LIPASE, BLOOD: Lipase: 17 U/L (ref 11–51)

## 2024-09-13 MED ORDER — KETOROLAC TROMETHAMINE 15 MG/ML IJ SOLN
15.0000 mg | Freq: Once | INTRAMUSCULAR | Status: AC
  Administered 2024-09-13: 15 mg via INTRAVENOUS
  Filled 2024-09-13: qty 1

## 2024-09-13 MED ORDER — SODIUM CHLORIDE 0.9 % IV BOLUS
1000.0000 mL | Freq: Once | INTRAVENOUS | Status: AC
  Administered 2024-09-13: 1000 mL via INTRAVENOUS

## 2024-09-13 MED ORDER — LIDOCAINE VISCOUS HCL 2 % MT SOLN
15.0000 mL | Freq: Once | OROMUCOSAL | Status: AC
  Administered 2024-09-13: 15 mL via ORAL
  Filled 2024-09-13: qty 15

## 2024-09-13 MED ORDER — ALUM & MAG HYDROXIDE-SIMETH 200-200-20 MG/5ML PO SUSP
30.0000 mL | Freq: Once | ORAL | Status: AC
  Administered 2024-09-13: 30 mL via ORAL
  Filled 2024-09-13: qty 30

## 2024-09-13 MED ORDER — DICYCLOMINE HCL 10 MG PO CAPS
10.0000 mg | ORAL_CAPSULE | Freq: Once | ORAL | Status: AC
  Administered 2024-09-13: 10 mg via ORAL
  Filled 2024-09-13: qty 1

## 2024-09-13 MED ORDER — DIPHENHYDRAMINE HCL 50 MG/ML IJ SOLN
25.0000 mg | Freq: Once | INTRAMUSCULAR | Status: AC
  Administered 2024-09-13: 25 mg via INTRAVENOUS
  Filled 2024-09-13: qty 1

## 2024-09-13 MED ORDER — SODIUM CHLORIDE 0.9 % IV BOLUS
500.0000 mL | Freq: Once | INTRAVENOUS | Status: AC
  Administered 2024-09-13: 500 mL via INTRAVENOUS

## 2024-09-13 MED ORDER — SODIUM CHLORIDE 0.9 % IV SOLN
25.0000 mg | Freq: Once | INTRAVENOUS | Status: DC
  Filled 2024-09-13: qty 1

## 2024-09-13 MED ORDER — FAMOTIDINE 20 MG PO TABS
20.0000 mg | ORAL_TABLET | Freq: Once | ORAL | Status: AC
  Administered 2024-09-13: 20 mg via ORAL
  Filled 2024-09-13: qty 1

## 2024-09-13 MED ORDER — DROPERIDOL 2.5 MG/ML IJ SOLN
2.5000 mg | Freq: Once | INTRAMUSCULAR | Status: AC
  Administered 2024-09-13: 2.5 mg via INTRAVENOUS
  Filled 2024-09-13: qty 2

## 2024-09-13 MED ORDER — ONDANSETRON 4 MG PO TBDP: 4.0000 mg | ORAL_TABLET | Freq: Three times a day (TID) | ORAL | 0 refills | Status: AC | PRN

## 2024-09-13 MED ORDER — ONDANSETRON HCL 4 MG/2ML IJ SOLN
4.0000 mg | Freq: Once | INTRAMUSCULAR | Status: AC
  Administered 2024-09-13: 4 mg via INTRAVENOUS
  Filled 2024-09-13: qty 2

## 2024-09-13 NOTE — ED Provider Notes (Signed)
 "  [facility] Provider Note    Event Date/Time   First MD Initiated Contact with Patient 09/13/24 1500     (approximate)   History   Vomiting   HPI  Diana Ramos is a 38 y.o. female history of lupus, presenting with concern of nausea vomiting lower abdominal pain.  Around 11 AM this morning developed bilateral lower abdominal pain accompanied with multiple episodes of diarrhea and persistent nausea vomiting episodes.  Apparently has had similar episodes before about couple months ago most recently at that time was given antiemetics and fluids and she was able to go home feeling much better.  Triage note does note chest pain and shortness of breath as well, seems that the patient does have this accompanying symptoms but she feels that this is secondary to her multiple episodes of vomiting, as this feels improved right now.  Has not taken anything for her symptoms.  Recently ill with COVID although this was about a week ago and her symptoms were improving.  History of tubal ligation as well as multiple prior cesarean deliveries but no other abdominal surgical history.     Physical Exam   Triage Vital Signs: ED Triage Vitals  Encounter Vitals Group     BP 09/13/24 1410 (!) 111/36     Girls Systolic BP Percentile --      Girls Diastolic BP Percentile --      Boys Systolic BP Percentile --      Boys Diastolic BP Percentile --      Pulse Rate 09/13/24 1410 89     Resp 09/13/24 1410 20     Temp 09/13/24 1410 97.8 F (36.6 C)     Temp Source 09/13/24 1410 Oral     SpO2 09/13/24 1410 100 %     Weight 09/13/24 1408 139 lb 15.9 oz (63.5 kg)     Height --      Head Circumference --      Peak Flow --      Pain Score 09/13/24 1408 7     Pain Loc --      Pain Education --      Exclude from Growth Chart --     Most recent vital signs: Vitals:   09/13/24 1754 09/13/24 1910  BP:  105/79  Pulse: 69 73  Resp: 19 18  Temp:  98.1 F (36.7 C)  SpO2: 100%  100%     General: Awake, uncomfortable gagging and emesis bag CV:  Good peripheral perfusion.  Resp:  Normal effort.  Abd:  No distention.  Mild discomfort to palpation along the lower abdomen, no guarding rebound rigidity Other:     ED Results / Procedures / Treatments   Labs (all labs ordered are listed, but only abnormal results are displayed) Labs Reviewed  COMPREHENSIVE METABOLIC PANEL WITH GFR - Abnormal; Notable for the following components:      Result Value   CO2 17 (*)    Glucose, Bld 109 (*)    Anion gap 20 (*)    All other components within normal limits  LIPASE, BLOOD  CBC  URINALYSIS, ROUTINE W REFLEX MICROSCOPIC  POC URINE PREG, ED     EKG  On my independent interpretation of this EKG sinus rhythm with rate of about 60, axis of about 80, intervals appear to be within normal limits, no obvious ischemia that I appreciate this EKG   RADIOLOGY   PROCEDURES:  Critical Care performed: No  Procedures  MEDICATIONS ORDERED IN ED: Medications  sodium chloride  0.9 % bolus 1,000 mL (0 mLs Intravenous Stopped 09/13/24 1624)  ondansetron  (ZOFRAN ) injection 4 mg (4 mg Intravenous Given 09/13/24 1535)  ketorolac  (TORADOL ) 15 MG/ML injection 15 mg (15 mg Intravenous Given 09/13/24 1536)  droperidol  (INAPSINE ) 2.5 MG/ML injection 2.5 mg (2.5 mg Intravenous Given 09/13/24 1622)  diphenhydrAMINE  (BENADRYL ) injection 25 mg (25 mg Intravenous Given 09/13/24 1622)  famotidine  (PEPCID ) tablet 20 mg (20 mg Oral Given 09/13/24 1622)  alum & mag hydroxide-simeth (MAALOX/MYLANTA) 200-200-20 MG/5ML suspension 30 mL (30 mLs Oral Given 09/13/24 1750)    And  lidocaine  (XYLOCAINE ) 2 % viscous mouth solution 15 mL (15 mLs Oral Given 09/13/24 1749)  dicyclomine  (BENTYL ) capsule 10 mg (10 mg Oral Given 09/13/24 1622)  sodium chloride  0.9 % bolus 500 mL (0 mLs Intravenous Stopped 09/13/24 1746)     IMPRESSION / MDM / ASSESSMENT AND PLAN / ED COURSE  I reviewed the triage  vital signs and the nursing notes.                               Patient's presentation is most consistent with acute complicated illness / injury requiring diagnostic workup.  38 year old female presenting today with concern of nausea vomiting abdominal pain.  Vitals here are reassuring.  Reviewed her recent ER visit where she had similar symptoms and her symptoms had improved at that time.  Her abdominal exam is reassuring, her complaints of chest pain and shortness of breath appear to be consistent with episodes of emesis and currently well-controlled.  EKG is also without ischemia.  Given her presentation here at this time, possibly gastroenteritis versus secondary to her recent COVID infection amongst other etiologies I feel unlikely surgical pathology, and I believe reasonable to hold off on imaging at this time.  Will attempt symptomatic management and reassess to help determine appropriate and safe disposition accordingly.   Clinical Course as of 09/13/24 1930  Wed Sep 13, 2024  1553 Pain continues to be symptomatic here, although nausea does seem to be improved and her symptoms do seem to be improved.  Will add in a second dose of antiemetics as well as a GI cocktail and reassess. [SK]  1713 Patient symptoms improved, will have the patient p.o. challenge and anticipate likely reasonable for discharge home. [SK]    Clinical Course User Index [SK] Fernand Rossie HERO, MD      FINAL CLINICAL IMPRESSION(S) / ED DIAGNOSES   Final diagnoses:  Nausea and vomiting, unspecified vomiting type     Rx / DC Orders   ED Discharge Orders          Ordered    ondansetron  (ZOFRAN -ODT) 4 MG disintegrating tablet  Every 8 hours PRN        09/13/24 1737             Note:  This document was prepared using Dragon voice recognition software and may include unintentional dictation errors.   Fernand Rossie HERO, MD 09/13/24 1930  "

## 2024-09-13 NOTE — ED Triage Notes (Signed)
 C/O vomiting, diarrhea, SOB/ Chest pain onset today at around 11 am   AAOx3. Anxious. NAD

## 2024-09-13 NOTE — ED Notes (Signed)
 Crying, hyperventilating, NAD, anxious, panicky. Encouraged to focus on tangible objects and breathing. Family at [facility].

## 2024-09-13 NOTE — Discharge Instructions (Signed)
 You were seen today due to concern of nausea and vomiting.  At this time fortunately your lab work is reassuring.  I have written for some nausea medication for you to take, please take this as instructed.  If you have any worsening of symptoms such as significant increase in abdominal pain, uncontrolled nausea, vomiting, or any other symptoms you find concerning please return to the emergency department immediately for further medical management.

## 2024-09-20 ENCOUNTER — Other Ambulatory Visit: Payer: Self-pay

## 2024-09-20 MED ORDER — FAMOTIDINE 20 MG PO TABS
20.0000 mg | ORAL_TABLET | Freq: Two times a day (BID) | ORAL | 1 refills | Status: AC
  Filled 2024-09-20: qty 60, 30d supply, fill #0

## 2024-09-20 MED ORDER — ONDANSETRON 4 MG PO TBDP
8.0000 mg | ORAL_TABLET | Freq: Two times a day (BID) | ORAL | 1 refills | Status: AC | PRN
  Filled 2024-09-20: qty 20, 5d supply, fill #0

## 2024-10-05 ENCOUNTER — Ambulatory Visit: Admitting: Psychiatry

## 2024-10-23 ENCOUNTER — Encounter: Payer: Self-pay | Admitting: Dermatology

## 2024-10-25 ENCOUNTER — Other Ambulatory Visit: Payer: Self-pay | Admitting: Dermatology

## 2024-10-25 ENCOUNTER — Other Ambulatory Visit: Payer: Self-pay

## 2024-10-25 DIAGNOSIS — L700 Acne vulgaris: Secondary | ICD-10-CM

## 2024-10-25 MED ORDER — CLINDAMYCIN PHOSPHATE 1 % EX SWAB
1.0000 | Freq: Two times a day (BID) | CUTANEOUS | 11 refills | Status: AC | PRN
  Filled 2024-10-25: qty 60, 30d supply, fill #0

## 2024-10-25 MED ORDER — ADAPALENE 0.3 % EX GEL
1.0000 | Freq: Every day | CUTANEOUS | 11 refills | Status: AC
  Filled 2024-10-25: qty 45, 34d supply, fill #0

## 2025-07-31 ENCOUNTER — Ambulatory Visit: Admitting: Dermatology
